# Patient Record
Sex: Female | Born: 1945 | Race: Black or African American | Hispanic: No | Marital: Married | Smoking: Never smoker
Health system: Southern US, Community
[De-identification: ages and names within clinical notes are randomized; demographics above are authoritative.]

## PROBLEM LIST (undated history)

## (undated) HISTORY — PX: BREAST EXCISIONAL BIOPSY: SUR124

---

## 1998-02-22 ENCOUNTER — Emergency Department (HOSPITAL_COMMUNITY): Admission: EM | Admit: 1998-02-22 | Discharge: 1998-02-22 | Payer: Self-pay | Admitting: Emergency Medicine

## 1999-07-10 ENCOUNTER — Encounter: Admission: RE | Admit: 1999-07-10 | Discharge: 1999-07-10 | Payer: Self-pay | Admitting: *Deleted

## 1999-07-10 ENCOUNTER — Encounter: Payer: Self-pay | Admitting: *Deleted

## 2000-08-14 ENCOUNTER — Encounter: Admission: RE | Admit: 2000-08-14 | Discharge: 2000-08-14 | Payer: Self-pay

## 2002-02-10 ENCOUNTER — Encounter: Payer: Self-pay | Admitting: *Deleted

## 2002-02-10 ENCOUNTER — Encounter: Admission: RE | Admit: 2002-02-10 | Discharge: 2002-02-10 | Payer: Self-pay | Admitting: *Deleted

## 2003-02-16 ENCOUNTER — Encounter: Admission: RE | Admit: 2003-02-16 | Discharge: 2003-02-16 | Payer: Self-pay | Admitting: Family Medicine

## 2003-02-16 ENCOUNTER — Encounter: Payer: Self-pay | Admitting: Family Medicine

## 2004-03-13 ENCOUNTER — Encounter: Admission: RE | Admit: 2004-03-13 | Discharge: 2004-03-13 | Payer: Self-pay | Admitting: Family Medicine

## 2004-06-20 ENCOUNTER — Encounter: Admission: RE | Admit: 2004-06-20 | Discharge: 2004-06-20 | Payer: Self-pay | Admitting: Family Medicine

## 2004-09-08 ENCOUNTER — Other Ambulatory Visit: Admission: RE | Admit: 2004-09-08 | Discharge: 2004-09-08 | Payer: Self-pay | Admitting: Family Medicine

## 2004-10-27 ENCOUNTER — Ambulatory Visit (HOSPITAL_COMMUNITY): Admission: RE | Admit: 2004-10-27 | Discharge: 2004-10-27 | Payer: Self-pay | Admitting: Gastroenterology

## 2006-02-26 ENCOUNTER — Encounter: Admission: RE | Admit: 2006-02-26 | Discharge: 2006-02-26 | Payer: Self-pay | Admitting: Family Medicine

## 2007-02-28 ENCOUNTER — Encounter: Admission: RE | Admit: 2007-02-28 | Discharge: 2007-02-28 | Payer: Self-pay | Admitting: Family Medicine

## 2007-04-18 ENCOUNTER — Other Ambulatory Visit: Admission: RE | Admit: 2007-04-18 | Discharge: 2007-04-18 | Payer: Self-pay | Admitting: Family Medicine

## 2007-10-23 ENCOUNTER — Emergency Department (HOSPITAL_COMMUNITY): Admission: EM | Admit: 2007-10-23 | Discharge: 2007-10-23 | Payer: Self-pay | Admitting: Emergency Medicine

## 2008-03-02 ENCOUNTER — Encounter: Admission: RE | Admit: 2008-03-02 | Discharge: 2008-03-02 | Payer: Self-pay | Admitting: Family Medicine

## 2008-11-03 IMAGING — CR DG ANKLE COMPLETE 3+V*L*
3 series · 3 of 3 positions shown · non-contrast
Comparison: None.

CLINICAL DATA: Fell and injured left ankle.

LEFT ANKLE COMPLETE - 3+ VIEW 10/23/2007:

[t ankle joint ap left]
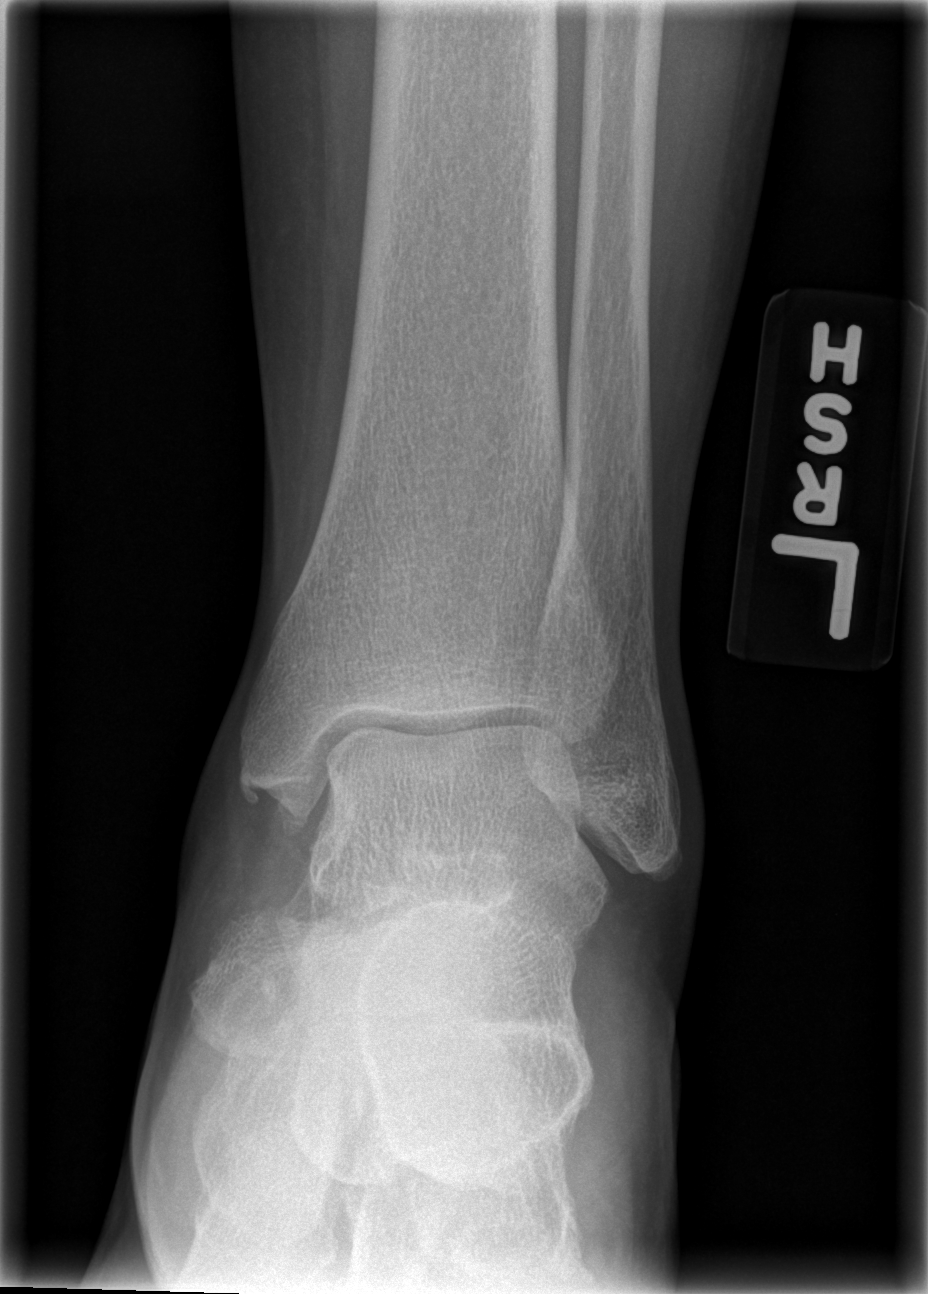

[t ankle joint oblique left]
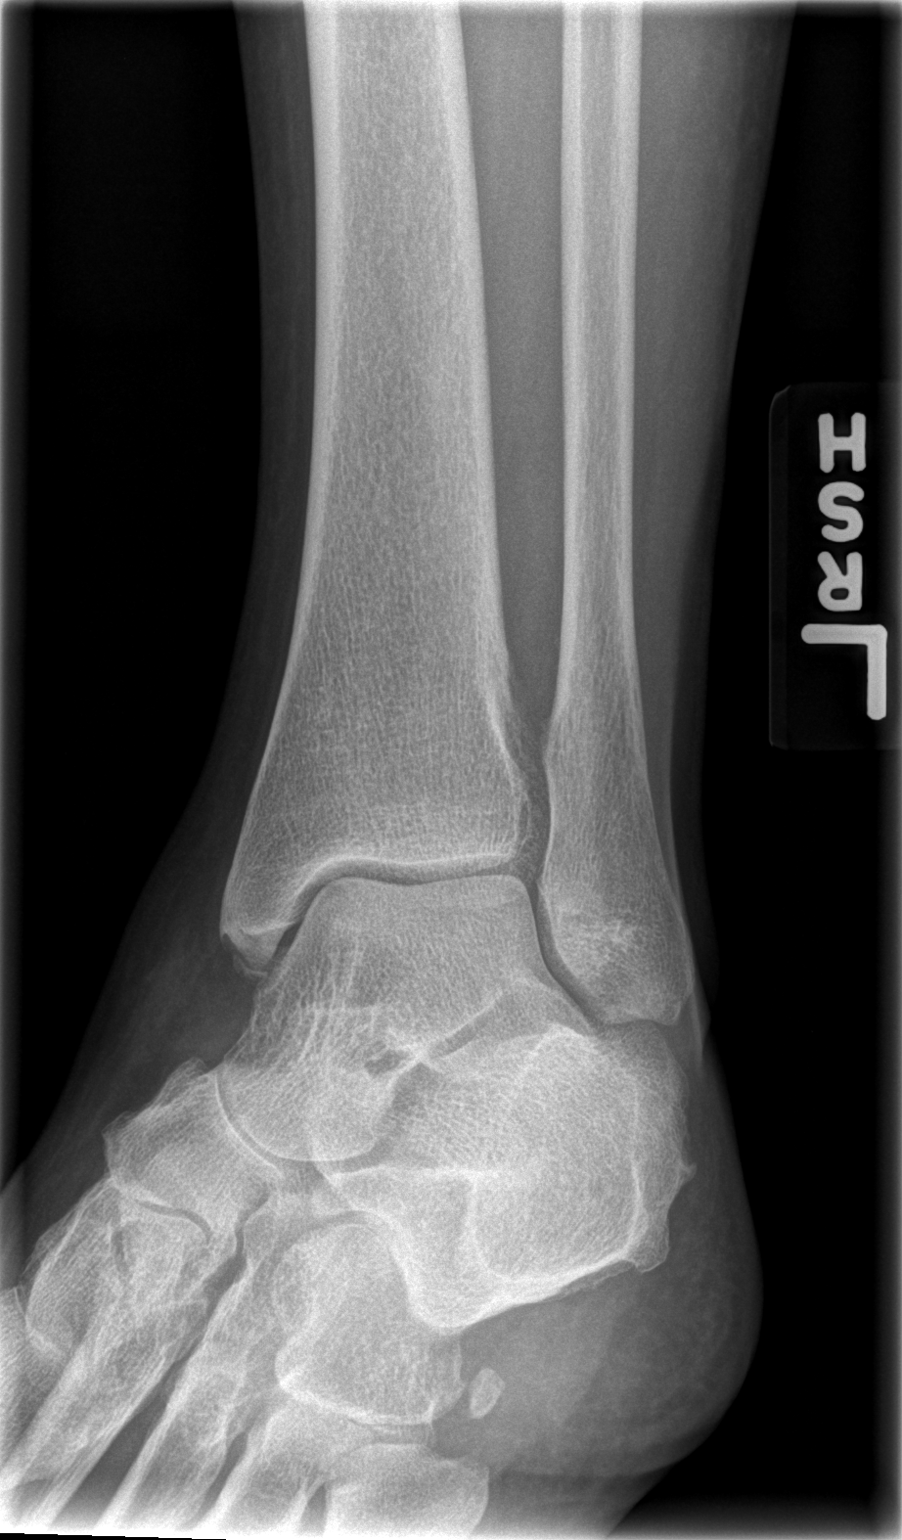

[t ankle joint lat left]
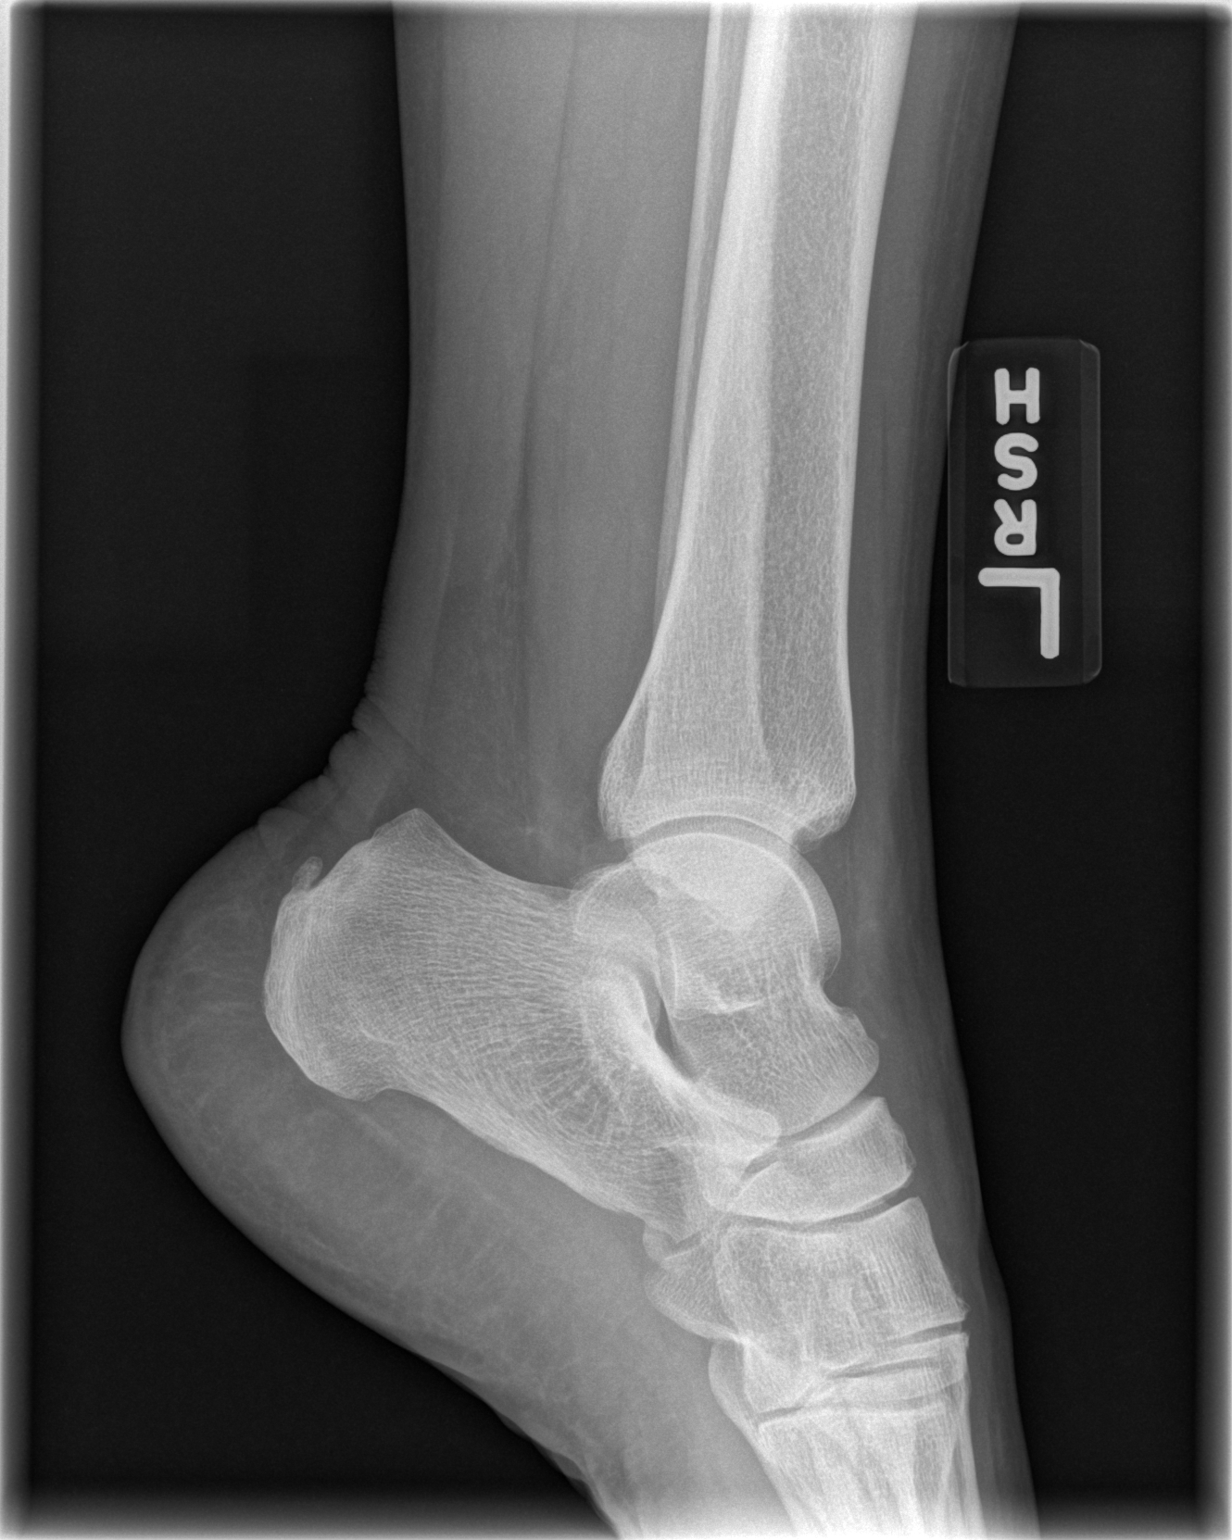

[3 of 3 positions shown; findings below may reference images not displayed]

FINDINGS: No evidence of acute fracture or dislocation.  Ankle
mortise intact.  Accessory ossicles or ligamentous ossification
related to old injuries adjacent to the medial malleolus.  Small
Achilles spur arising from the calcaneus.
IMPRESSION: No acute skeletal abnormalities.

## 2008-11-03 IMAGING — CR DG FOOT COMPLETE 3+V*L*
3 series · 3 of 3 positions shown · non-contrast
Comparison: None.

CLINICAL DATA: Fell and injured left foot.

LEFT FOOT - COMPLETE 3+ VIEW 10/23/2007:

[t foot ap left]
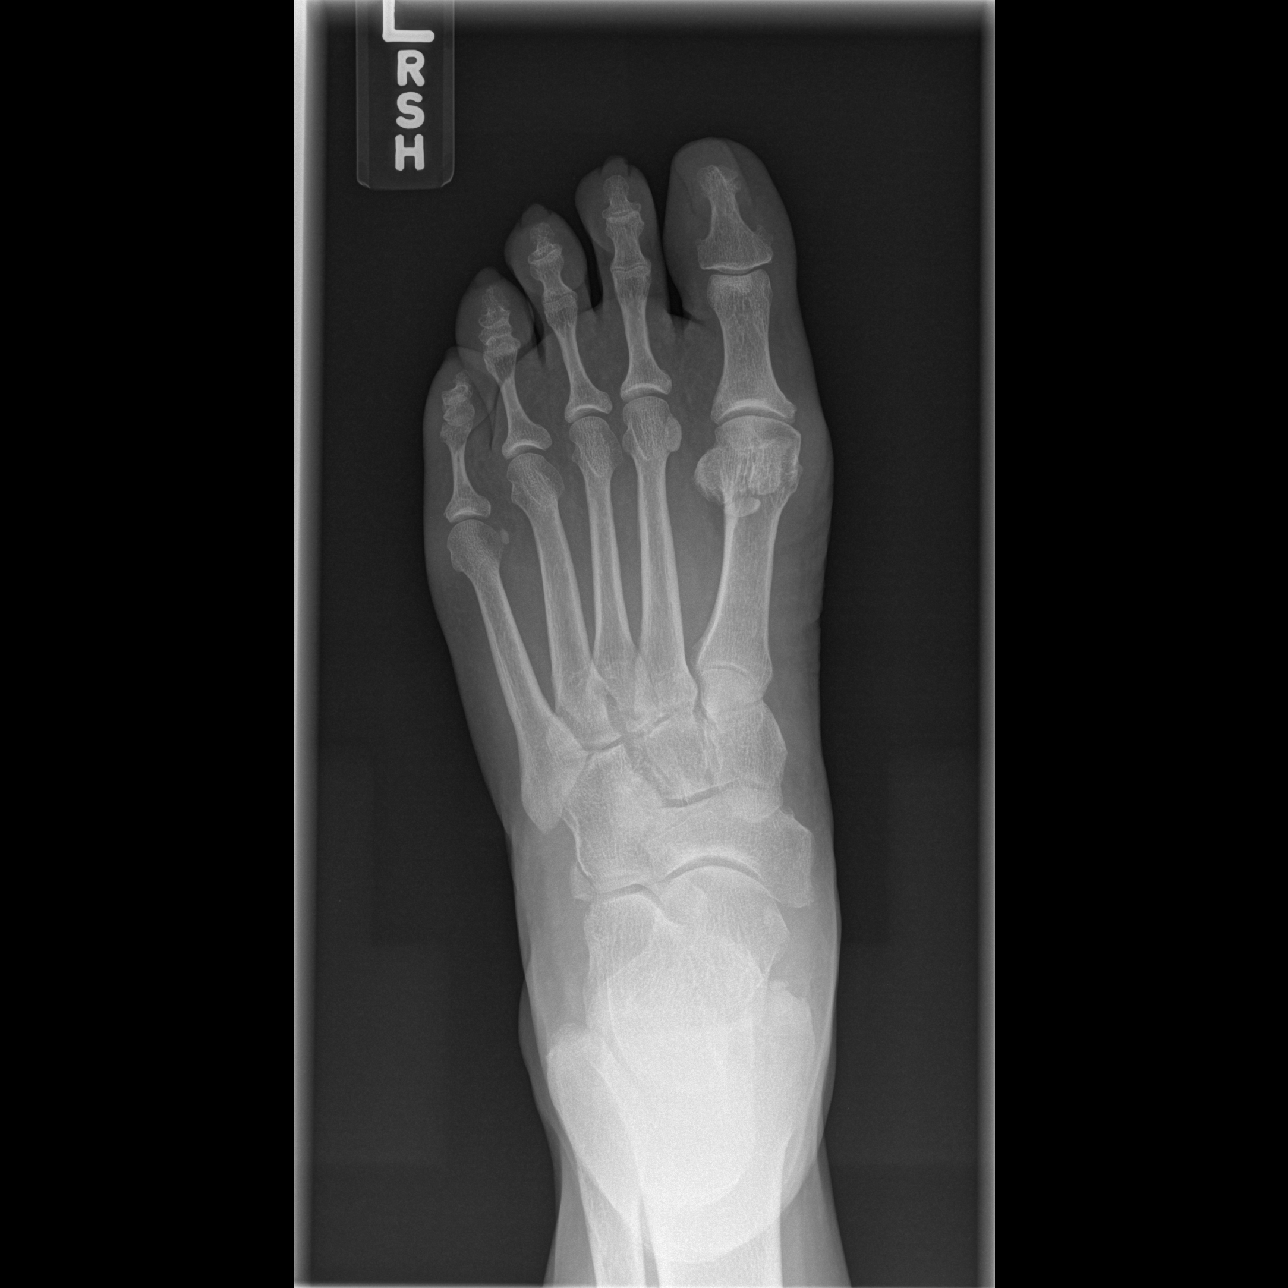

[t foot oblique left]
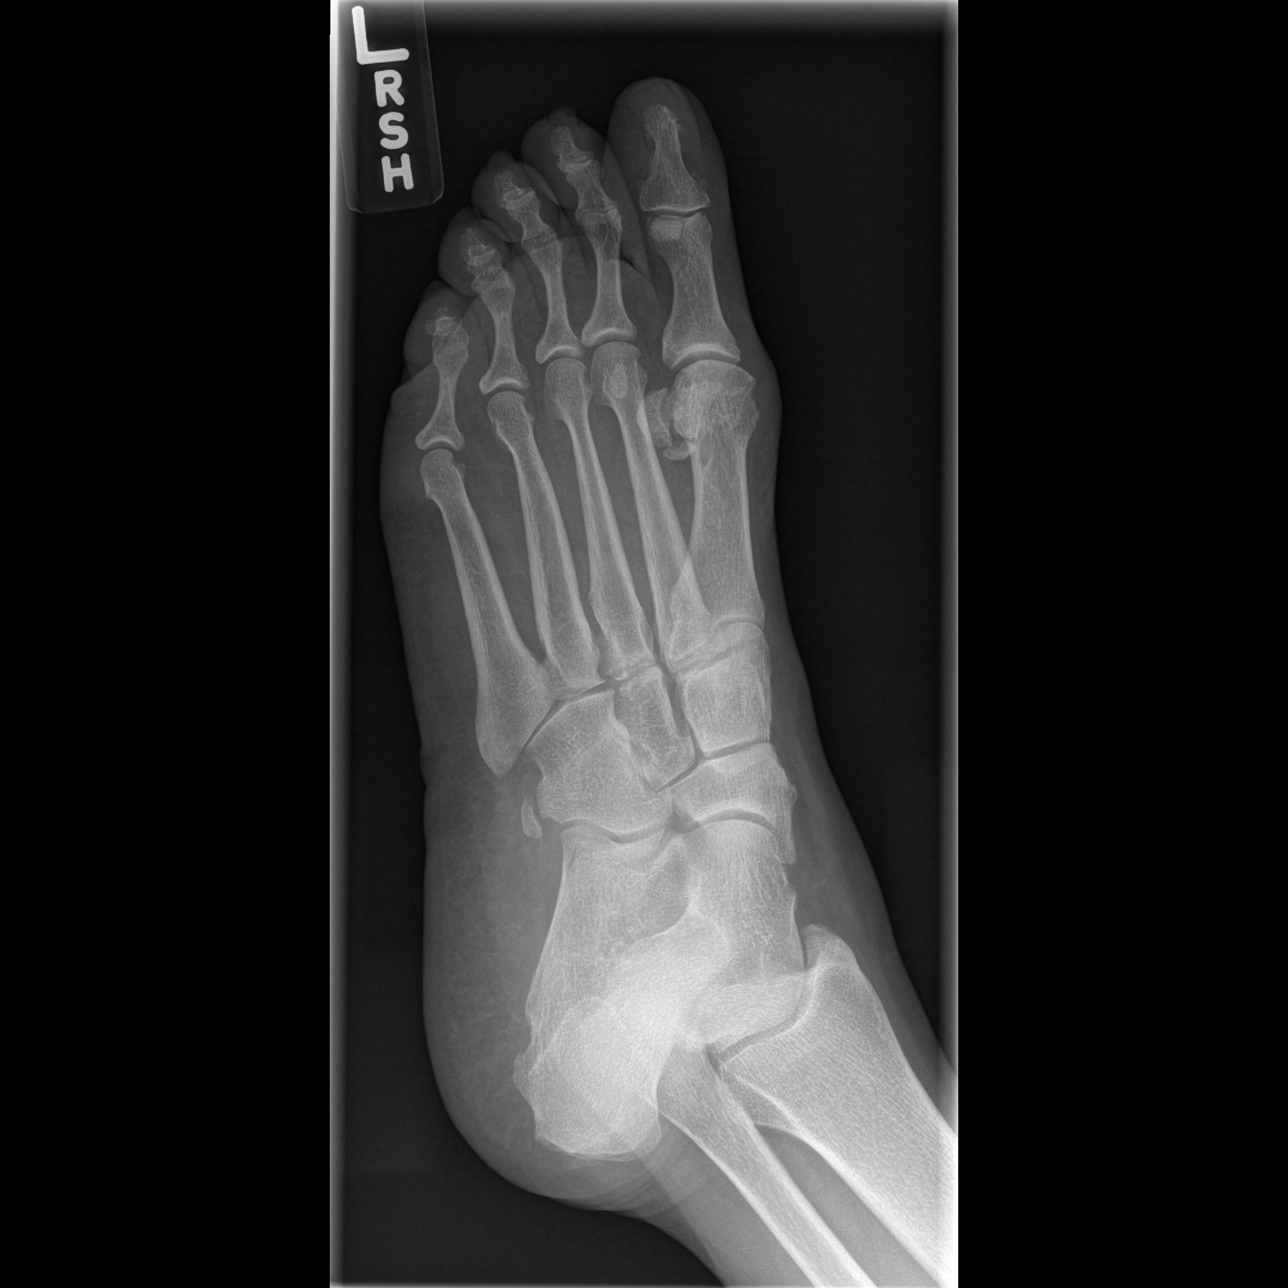

[t foot lat left]
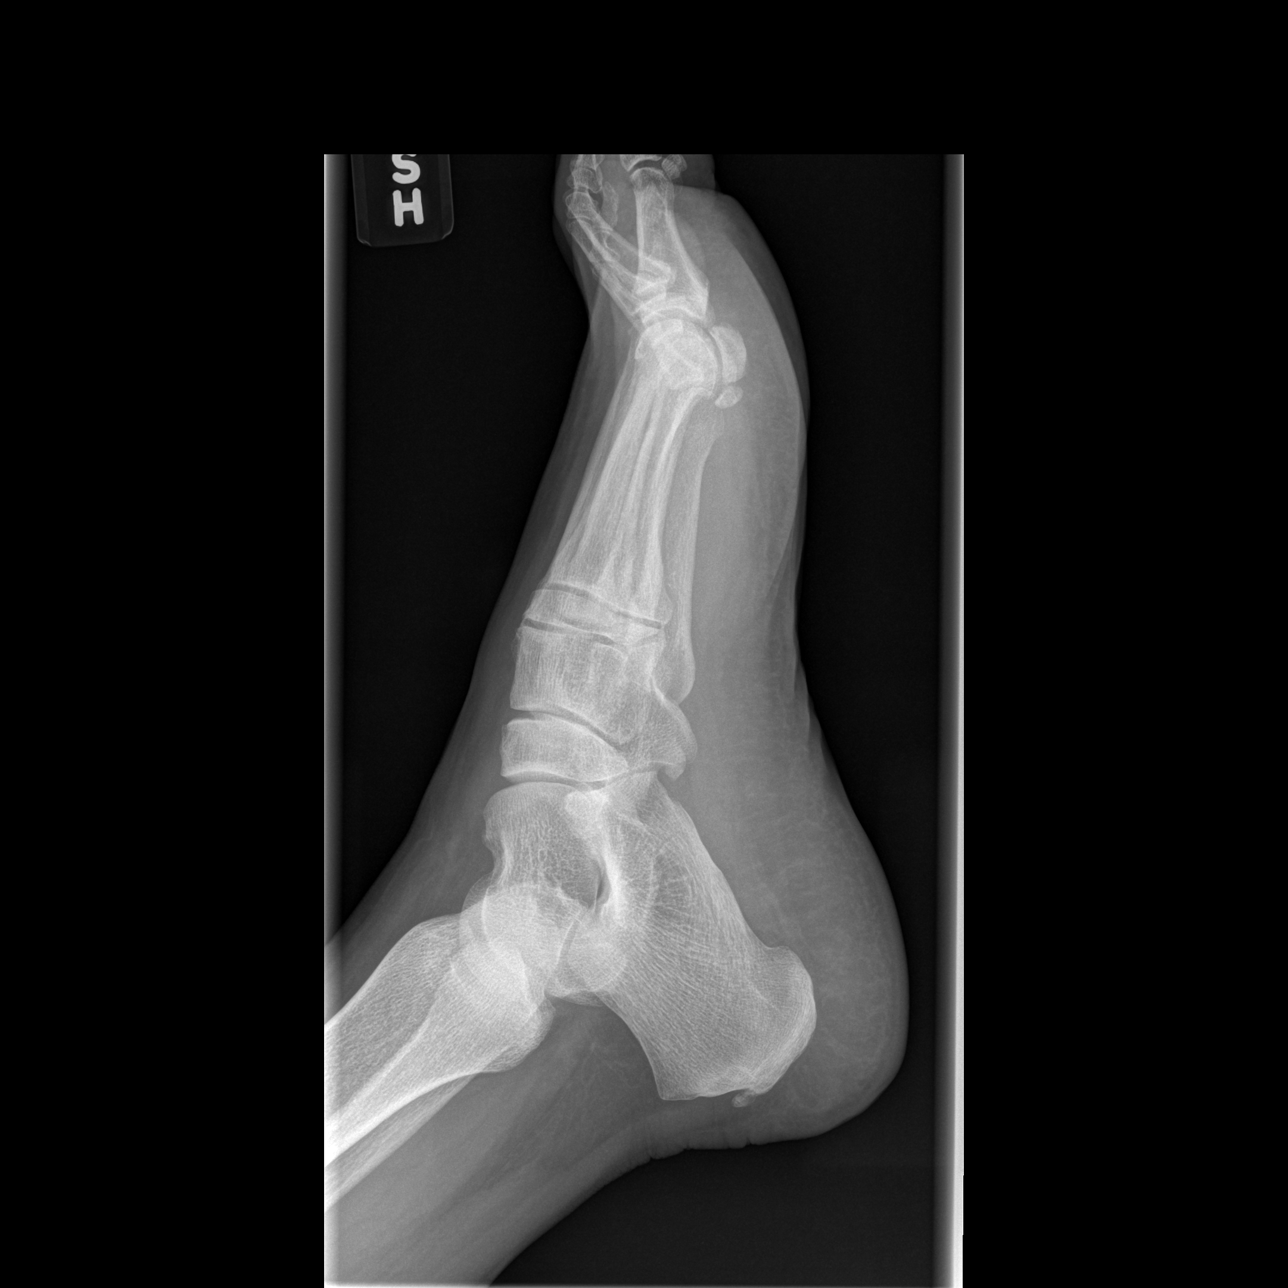

[3 of 3 positions shown; findings below may reference images not displayed]

FINDINGS: No evidence of acute fracture or dislocation.  Accessory
ossicle adjacent to the cuboid, the os peroneum.  Mild joint space
narrowing involving several IP joints, with well-preserved joint
spaces elsewhere.  Small Achilles spur arising from the calcaneus.
IMPRESSION: No acute skeletal abnormalities.

## 2009-03-03 ENCOUNTER — Encounter: Admission: RE | Admit: 2009-03-03 | Discharge: 2009-03-03 | Payer: Self-pay | Admitting: Family Medicine

## 2010-03-06 ENCOUNTER — Encounter: Admission: RE | Admit: 2010-03-06 | Discharge: 2010-03-06 | Payer: Self-pay | Admitting: Family Medicine

## 2010-09-15 NOTE — Op Note (Signed)
[name], Bailey Hines            ACCOUNT NO.:  [id number]   MEDICAL RECORD NO.:  [id number]          PATIENT TYPE:  AMB   LOCATION:  ENDO                         FACILITY:  [facility]   PHYSICIAN:  Graylin Shiver, M.D.   DATE OF BIRTH:  09/09/1945   DATE OF PROCEDURE:  10/27/2004  DATE OF DISCHARGE:                                 OPERATIVE REPORT   INDICATIONS FOR PROCEDURE:  Screening.   Informed consent was obtained after explanation of the risks of bleeding,  infection and perforation.   PREMEDICATION:  Fentanyl 50 mcg IV, Versed 6 mg IV.   PROCEDURE:  With the patient in the left lateral decubitus position, a  rectal exam was performed. No masses were felt. The Olympus colonoscope was  inserted into the rectum and advanced around the colon to the cecum. Cecal  landmarks were identified. The cecum and ascending colon were normal. The  transverse colon normal. The descending colon normal. The sigmoid showed a  few diverticula and the rectum was normal. She tolerated the procedure well  without complications.   IMPRESSION:  Few diverticula noted in the sigmoid, otherwise normal  colonoscopy to the cecum.   I would recommend a follow-up screening colonoscopy again in 10 years.       SFG/MEDQ  D:  10/27/2004  T:  10/27/2004  Job:  [id number]   cc:   Stacie Acres. White, M.D.  [address]  [place]  [place] [address]  Fax: [phone]

## 2011-01-23 ENCOUNTER — Other Ambulatory Visit: Payer: Self-pay | Admitting: Family Medicine

## 2011-01-23 DIAGNOSIS — Z1231 Encounter for screening mammogram for malignant neoplasm of breast: Secondary | ICD-10-CM

## 2011-03-09 ENCOUNTER — Ambulatory Visit
Admission: RE | Admit: 2011-03-09 | Discharge: 2011-03-09 | Disposition: A | Discharge status: Home / Self Care | Payer: Medicare Other | Source: Ambulatory Visit | Attending: Family Medicine | Admitting: Family Medicine

## 2011-03-09 DIAGNOSIS — Z1231 Encounter for screening mammogram for malignant neoplasm of breast: Secondary | ICD-10-CM

## 2011-03-12 ENCOUNTER — Other Ambulatory Visit: Payer: Self-pay | Admitting: Family Medicine

## 2011-03-12 DIAGNOSIS — R928 Other abnormal and inconclusive findings on diagnostic imaging of breast: Secondary | ICD-10-CM

## 2011-03-29 ENCOUNTER — Ambulatory Visit
Admission: RE | Admit: 2011-03-29 | Discharge: 2011-03-29 | Disposition: A | Discharge status: Home / Self Care | Payer: Medicare Other | Source: Ambulatory Visit | Attending: Family Medicine | Admitting: Family Medicine

## 2011-03-29 DIAGNOSIS — R928 Other abnormal and inconclusive findings on diagnostic imaging of breast: Secondary | ICD-10-CM

## 2011-07-06 DIAGNOSIS — L259 Unspecified contact dermatitis, unspecified cause: Secondary | ICD-10-CM | POA: Diagnosis not present

## 2011-09-27 DIAGNOSIS — E7800 Pure hypercholesterolemia, unspecified: Secondary | ICD-10-CM | POA: Diagnosis not present

## 2012-02-01 ENCOUNTER — Other Ambulatory Visit: Payer: Self-pay | Admitting: Family Medicine

## 2012-02-01 DIAGNOSIS — Z1231 Encounter for screening mammogram for malignant neoplasm of breast: Secondary | ICD-10-CM

## 2012-03-10 ENCOUNTER — Ambulatory Visit
Admission: RE | Admit: 2012-03-10 | Discharge: 2012-03-10 | Disposition: A | Discharge status: Home / Self Care | Payer: Medicare Other | Source: Ambulatory Visit | Attending: Family Medicine | Admitting: Family Medicine

## 2012-03-10 DIAGNOSIS — Z1231 Encounter for screening mammogram for malignant neoplasm of breast: Secondary | ICD-10-CM

## 2012-05-06 DIAGNOSIS — E7800 Pure hypercholesterolemia, unspecified: Secondary | ICD-10-CM | POA: Diagnosis not present

## 2012-10-17 DIAGNOSIS — H2510 Age-related nuclear cataract, unspecified eye: Secondary | ICD-10-CM | POA: Diagnosis not present

## 2012-10-17 DIAGNOSIS — H40009 Preglaucoma, unspecified, unspecified eye: Secondary | ICD-10-CM | POA: Diagnosis not present

## 2013-02-02 DIAGNOSIS — L658 Other specified nonscarring hair loss: Secondary | ICD-10-CM | POA: Diagnosis not present

## 2013-02-02 DIAGNOSIS — L282 Other prurigo: Secondary | ICD-10-CM | POA: Diagnosis not present

## 2013-02-05 ENCOUNTER — Other Ambulatory Visit: Payer: Self-pay

## 2013-02-05 DIAGNOSIS — Z1231 Encounter for screening mammogram for malignant neoplasm of breast: Secondary | ICD-10-CM

## 2013-02-13 DIAGNOSIS — E7800 Pure hypercholesterolemia, unspecified: Secondary | ICD-10-CM | POA: Diagnosis not present

## 2013-03-13 ENCOUNTER — Ambulatory Visit
Admission: RE | Admit: 2013-03-13 | Discharge: 2013-03-13 | Disposition: A | Discharge status: Home / Self Care | Payer: Medicare Other | Source: Ambulatory Visit

## 2013-03-13 DIAGNOSIS — Z1231 Encounter for screening mammogram for malignant neoplasm of breast: Secondary | ICD-10-CM

## 2013-04-02 DIAGNOSIS — E785 Hyperlipidemia, unspecified: Secondary | ICD-10-CM | POA: Diagnosis not present

## 2013-04-02 DIAGNOSIS — Z1331 Encounter for screening for depression: Secondary | ICD-10-CM | POA: Diagnosis not present

## 2013-04-02 DIAGNOSIS — R030 Elevated blood-pressure reading, without diagnosis of hypertension: Secondary | ICD-10-CM | POA: Diagnosis not present

## 2013-04-02 DIAGNOSIS — Z23 Encounter for immunization: Secondary | ICD-10-CM | POA: Diagnosis not present

## 2013-05-05 DIAGNOSIS — Z79899 Other long term (current) drug therapy: Secondary | ICD-10-CM | POA: Diagnosis not present

## 2013-05-05 DIAGNOSIS — E785 Hyperlipidemia, unspecified: Secondary | ICD-10-CM | POA: Diagnosis not present

## 2013-05-05 DIAGNOSIS — I10 Essential (primary) hypertension: Secondary | ICD-10-CM | POA: Diagnosis not present

## 2013-07-12 DIAGNOSIS — S93409A Sprain of unspecified ligament of unspecified ankle, initial encounter: Secondary | ICD-10-CM | POA: Diagnosis not present

## 2013-09-01 DIAGNOSIS — M25579 Pain in unspecified ankle and joints of unspecified foot: Secondary | ICD-10-CM | POA: Diagnosis not present

## 2013-10-12 DIAGNOSIS — E785 Hyperlipidemia, unspecified: Secondary | ICD-10-CM | POA: Diagnosis not present

## 2013-10-12 DIAGNOSIS — I10 Essential (primary) hypertension: Secondary | ICD-10-CM | POA: Diagnosis not present

## 2014-02-10 ENCOUNTER — Other Ambulatory Visit: Payer: Self-pay

## 2014-02-10 DIAGNOSIS — Z1231 Encounter for screening mammogram for malignant neoplasm of breast: Secondary | ICD-10-CM

## 2014-03-15 ENCOUNTER — Ambulatory Visit
Admission: RE | Admit: 2014-03-15 | Discharge: 2014-03-15 | Disposition: A | Discharge status: Home / Self Care | Payer: Medicare Other | Source: Ambulatory Visit

## 2014-03-15 ENCOUNTER — Encounter (INDEPENDENT_AMBULATORY_CARE_PROVIDER_SITE_OTHER): Payer: Self-pay

## 2014-03-15 DIAGNOSIS — Z1231 Encounter for screening mammogram for malignant neoplasm of breast: Secondary | ICD-10-CM

## 2014-03-25 IMAGING — MG MM SCREEN MAMMOGRAM BILATERAL
4 series · 4 of 4 positions shown · non-contrast
Comparison: Previous exam(s).

CLINICAL DATA: Screening.

EXAM:
DIGITAL SCREENING BILATERAL MAMMOGRAM WITH CAD

[R CC]
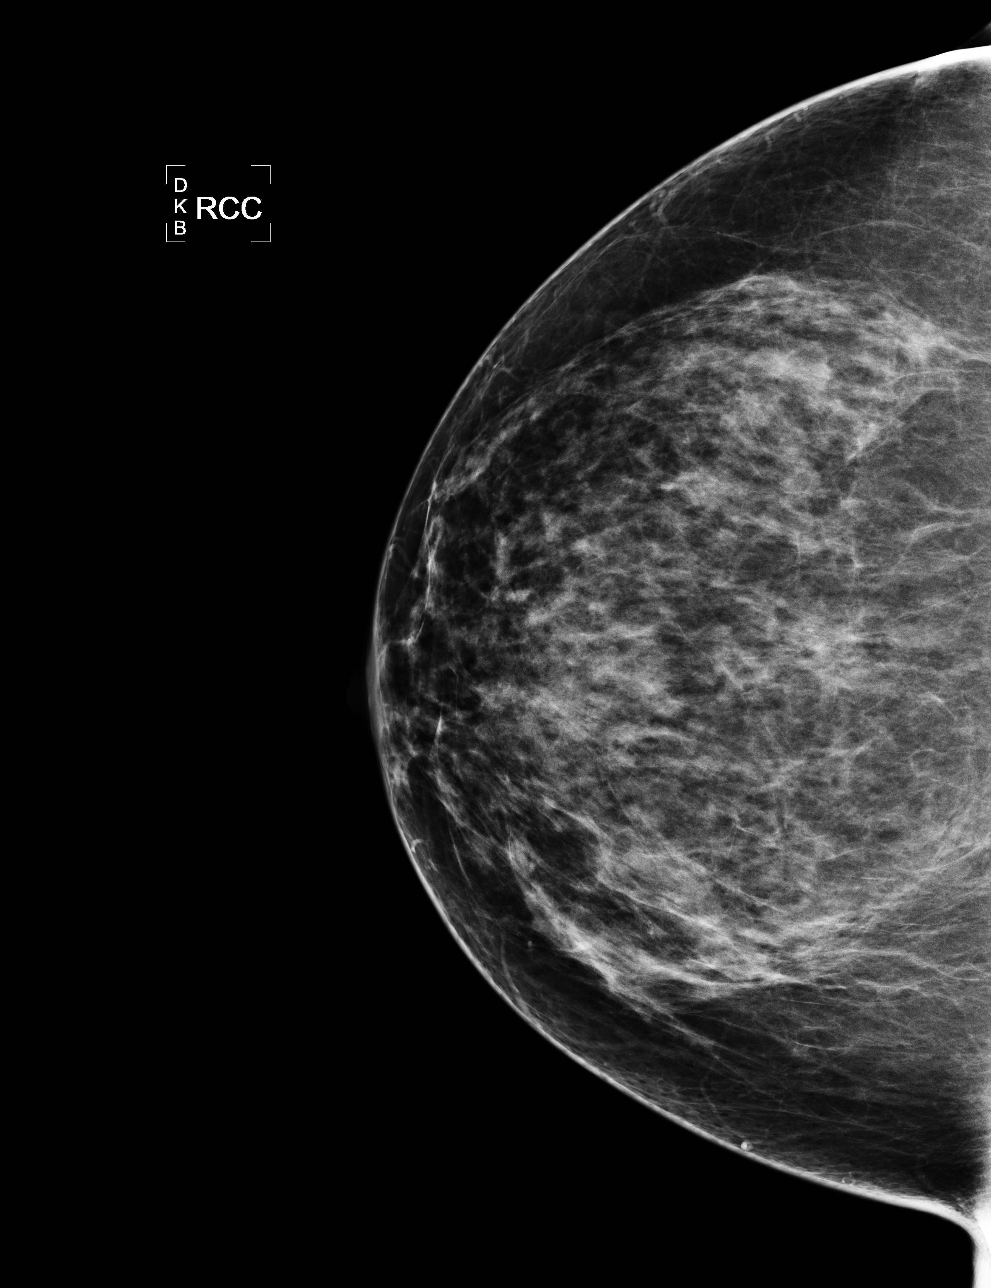

[L CC]
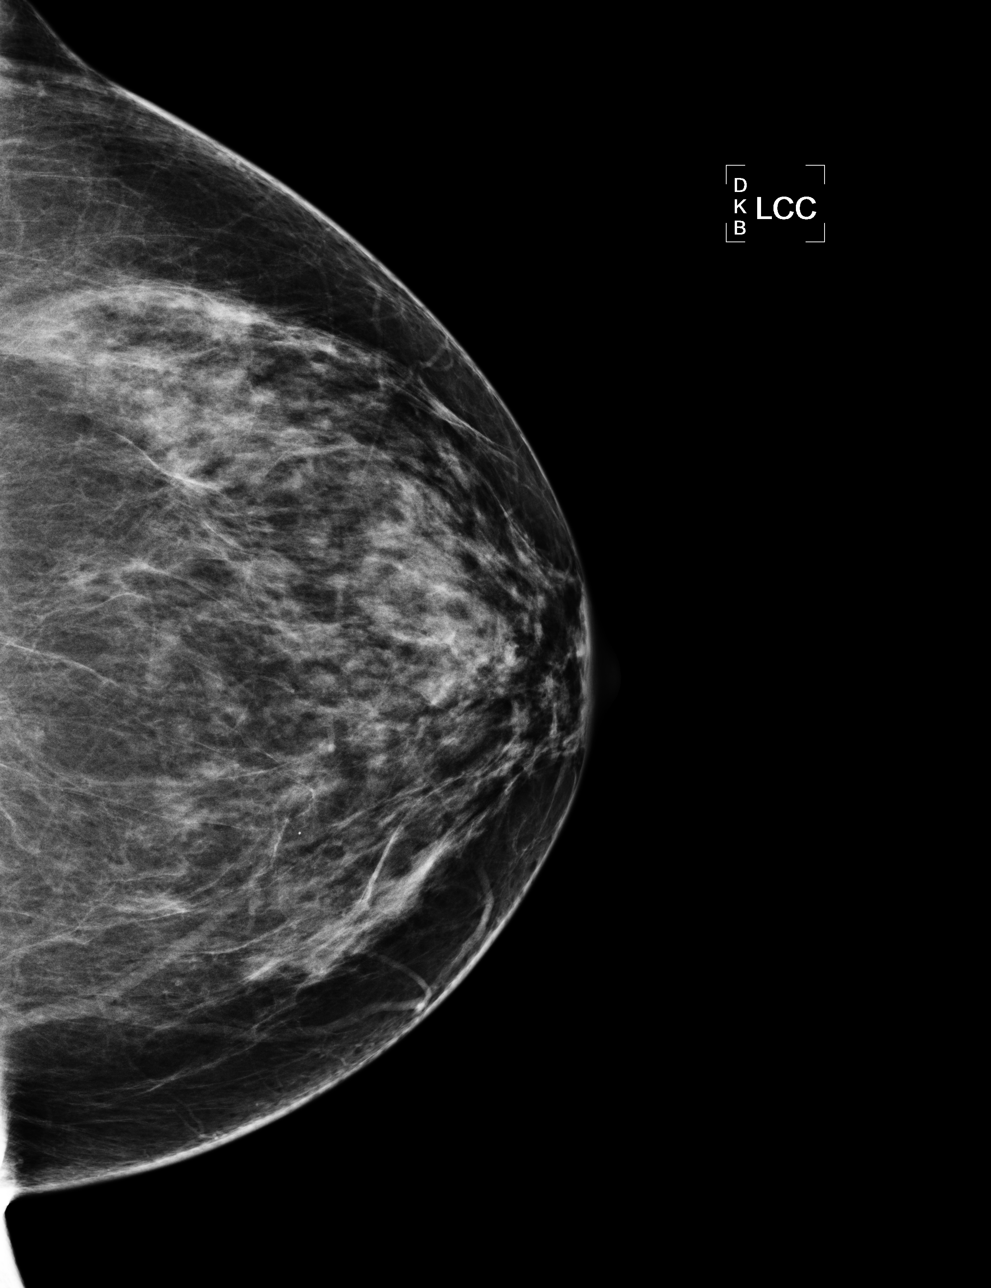

[L MLO]
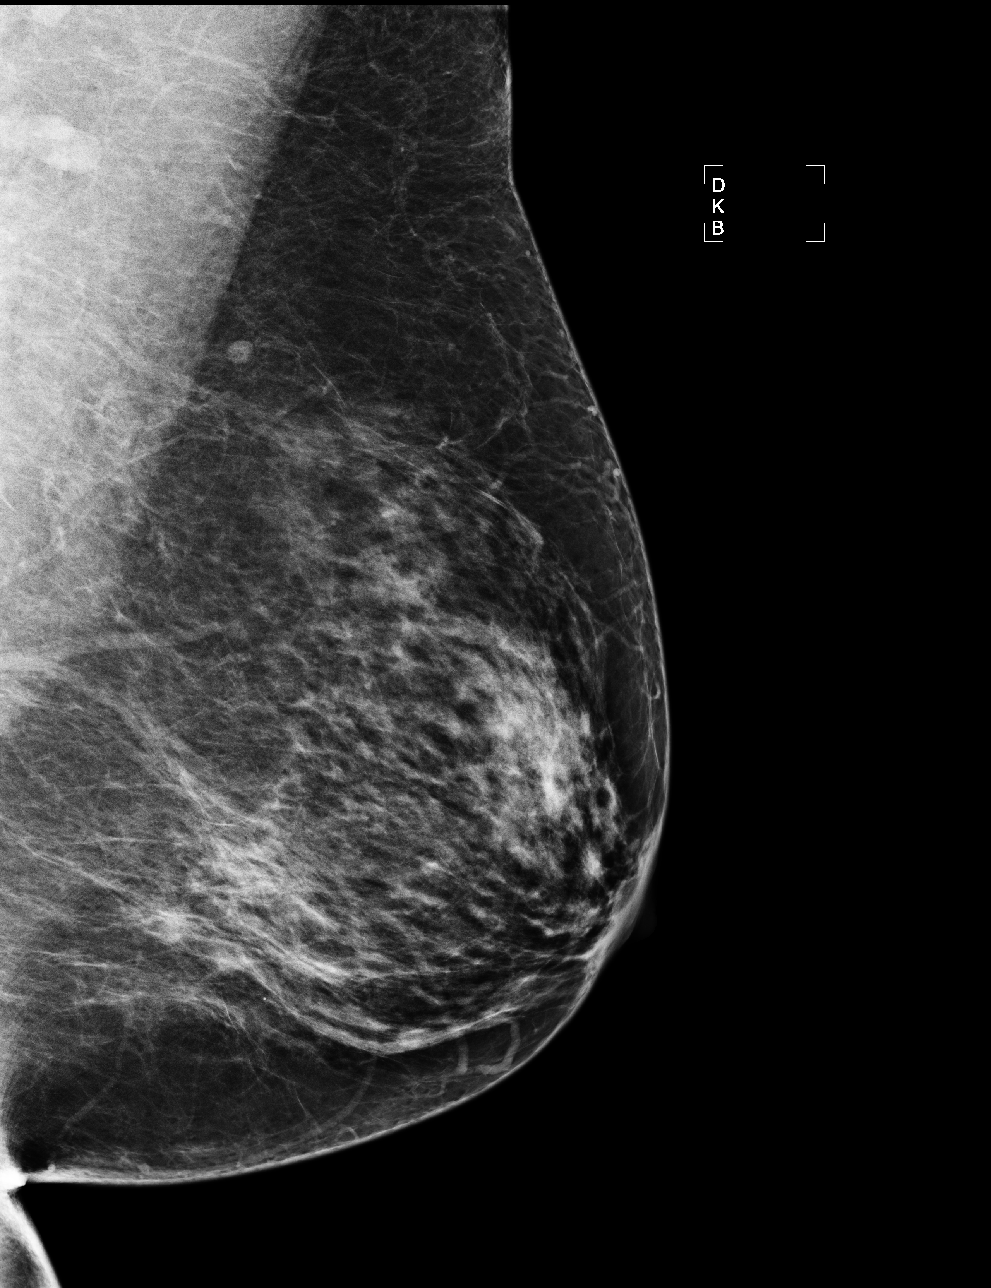

[R MLO]
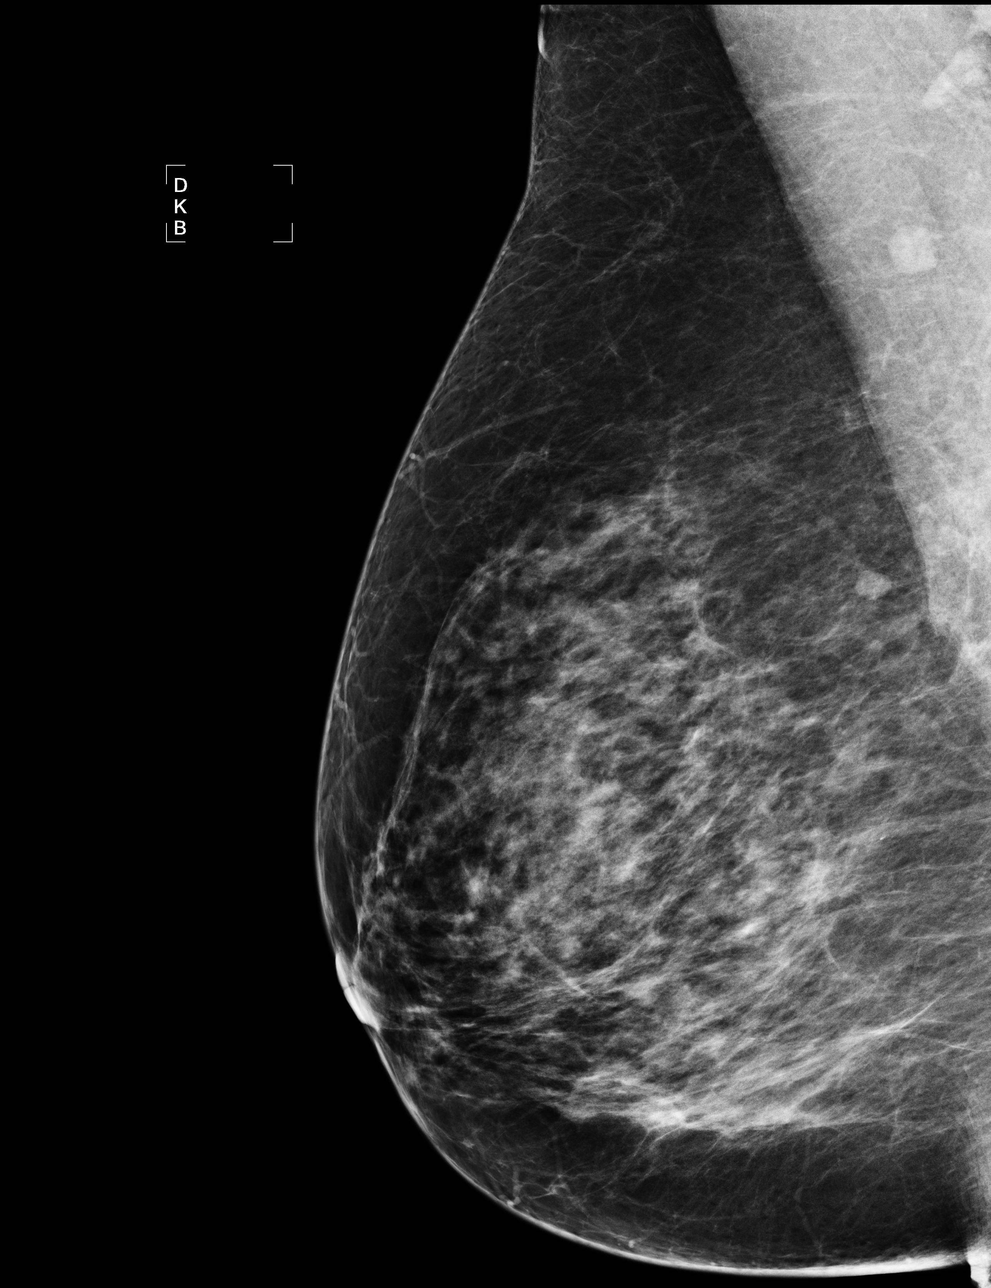

[4 of 4 positions shown; findings below may reference images not displayed]

ACR Breast Density Category c: The breasts are heterogeneously
dense, which may obscure small masses.
FINDINGS: There are no findings suspicious for malignancy. Images were
processed with CAD.
IMPRESSION: No mammographic evidence of malignancy. A result letter of this
screening mammogram will be mailed directly to the patient.

RECOMMENDATION:
Screening mammogram in one year. (Code:[id number])

BI-RADS CATEGORY  1: Negative

## 2014-04-14 ENCOUNTER — Other Ambulatory Visit (HOSPITAL_COMMUNITY)
Admission: RE | Admit: 2014-04-14 | Discharge: 2014-04-14 | Disposition: A | Discharge status: Home / Self Care | Payer: Medicare Other | Source: Ambulatory Visit | Attending: Family Medicine | Admitting: Family Medicine

## 2014-04-14 ENCOUNTER — Other Ambulatory Visit: Payer: Self-pay | Admitting: Family Medicine

## 2014-04-14 DIAGNOSIS — E785 Hyperlipidemia, unspecified: Secondary | ICD-10-CM | POA: Diagnosis not present

## 2014-04-14 DIAGNOSIS — Z124 Encounter for screening for malignant neoplasm of cervix: Secondary | ICD-10-CM | POA: Diagnosis not present

## 2014-04-14 DIAGNOSIS — I10 Essential (primary) hypertension: Secondary | ICD-10-CM | POA: Diagnosis not present

## 2014-04-14 DIAGNOSIS — N951 Menopausal and female climacteric states: Secondary | ICD-10-CM | POA: Diagnosis not present

## 2014-04-14 DIAGNOSIS — Z0000 Encounter for general adult medical examination without abnormal findings: Secondary | ICD-10-CM | POA: Diagnosis not present

## 2014-04-14 DIAGNOSIS — Z23 Encounter for immunization: Secondary | ICD-10-CM | POA: Diagnosis not present

## 2014-04-14 DIAGNOSIS — R3915 Urgency of urination: Secondary | ICD-10-CM | POA: Diagnosis not present

## 2014-04-14 DIAGNOSIS — R87619 Unspecified abnormal cytological findings in specimens from cervix uteri: Secondary | ICD-10-CM | POA: Insufficient documentation

## 2014-04-14 DIAGNOSIS — R87610 Atypical squamous cells of undetermined significance on cytologic smear of cervix (ASC-US): Secondary | ICD-10-CM | POA: Diagnosis not present

## 2014-04-14 DIAGNOSIS — Z1151 Encounter for screening for human papillomavirus (HPV): Secondary | ICD-10-CM | POA: Insufficient documentation

## 2014-04-14 DIAGNOSIS — Z1211 Encounter for screening for malignant neoplasm of colon: Secondary | ICD-10-CM | POA: Diagnosis not present

## 2014-04-14 DIAGNOSIS — A6000 Herpesviral infection of urogenital system, unspecified: Secondary | ICD-10-CM | POA: Diagnosis not present

## 2014-04-15 LAB — CYTOLOGY - PAP

## 2014-05-10 DIAGNOSIS — N951 Menopausal and female climacteric states: Secondary | ICD-10-CM | POA: Diagnosis not present

## 2015-02-07 ENCOUNTER — Other Ambulatory Visit: Payer: Self-pay

## 2015-02-07 DIAGNOSIS — Z1231 Encounter for screening mammogram for malignant neoplasm of breast: Secondary | ICD-10-CM

## 2015-03-17 ENCOUNTER — Ambulatory Visit
Admission: RE | Admit: 2015-03-17 | Discharge: 2015-03-17 | Disposition: A | Discharge status: Home / Self Care | Payer: Medicare PPO | Source: Ambulatory Visit

## 2015-03-17 DIAGNOSIS — Z1231 Encounter for screening mammogram for malignant neoplasm of breast: Secondary | ICD-10-CM

## 2015-03-18 ENCOUNTER — Other Ambulatory Visit: Payer: Self-pay | Admitting: Family Medicine

## 2015-03-18 DIAGNOSIS — R928 Other abnormal and inconclusive findings on diagnostic imaging of breast: Secondary | ICD-10-CM

## 2015-03-30 ENCOUNTER — Other Ambulatory Visit: Payer: Medicare PPO

## 2015-04-07 ENCOUNTER — Ambulatory Visit
Admission: RE | Admit: 2015-04-07 | Discharge: 2015-04-07 | Disposition: A | Discharge status: Home / Self Care | Payer: Medicare PPO | Source: Ambulatory Visit | Attending: Family Medicine | Admitting: Family Medicine

## 2015-04-07 DIAGNOSIS — R928 Other abnormal and inconclusive findings on diagnostic imaging of breast: Secondary | ICD-10-CM

## 2015-05-30 ENCOUNTER — Other Ambulatory Visit: Payer: Self-pay | Admitting: Family Medicine

## 2015-05-30 ENCOUNTER — Other Ambulatory Visit (HOSPITAL_COMMUNITY)
Admission: RE | Admit: 2015-05-30 | Discharge: 2015-05-30 | Disposition: A | Discharge status: Home / Self Care | Payer: Medicare Other | Source: Ambulatory Visit | Attending: Family Medicine | Admitting: Family Medicine

## 2015-05-30 DIAGNOSIS — Z124 Encounter for screening for malignant neoplasm of cervix: Secondary | ICD-10-CM | POA: Insufficient documentation

## 2015-05-31 LAB — CYTOLOGY - PAP

## 2016-02-27 ENCOUNTER — Other Ambulatory Visit: Payer: Self-pay | Admitting: Family Medicine

## 2016-02-27 DIAGNOSIS — Z1231 Encounter for screening mammogram for malignant neoplasm of breast: Secondary | ICD-10-CM

## 2016-04-03 ENCOUNTER — Ambulatory Visit
Admission: RE | Admit: 2016-04-03 | Discharge: 2016-04-03 | Disposition: A | Discharge status: Home / Self Care | Payer: Medicare Other | Source: Ambulatory Visit | Attending: Family Medicine | Admitting: Family Medicine

## 2016-04-03 DIAGNOSIS — Z1231 Encounter for screening mammogram for malignant neoplasm of breast: Secondary | ICD-10-CM

## 2017-02-22 ENCOUNTER — Other Ambulatory Visit: Payer: Self-pay | Admitting: Family Medicine

## 2017-02-22 DIAGNOSIS — Z1231 Encounter for screening mammogram for malignant neoplasm of breast: Secondary | ICD-10-CM

## 2017-04-04 ENCOUNTER — Ambulatory Visit
Admission: RE | Admit: 2017-04-04 | Discharge: 2017-04-04 | Disposition: A | Discharge status: Home / Self Care | Payer: Medicare Other | Source: Ambulatory Visit | Attending: Family Medicine | Admitting: Family Medicine

## 2017-04-04 DIAGNOSIS — Z1231 Encounter for screening mammogram for malignant neoplasm of breast: Secondary | ICD-10-CM

## 2018-02-24 ENCOUNTER — Other Ambulatory Visit: Payer: Self-pay | Admitting: Family Medicine

## 2018-02-24 DIAGNOSIS — Z1231 Encounter for screening mammogram for malignant neoplasm of breast: Secondary | ICD-10-CM

## 2018-04-08 ENCOUNTER — Ambulatory Visit
Admission: RE | Admit: 2018-04-08 | Discharge: 2018-04-08 | Disposition: A | Discharge status: Home / Self Care | Payer: Medicare Other | Source: Ambulatory Visit | Attending: Family Medicine | Admitting: Family Medicine

## 2018-04-08 DIAGNOSIS — Z1231 Encounter for screening mammogram for malignant neoplasm of breast: Secondary | ICD-10-CM

## 2018-12-08 ENCOUNTER — Ambulatory Visit (HOSPITAL_COMMUNITY)
Admission: EM | Admit: 2018-12-08 | Discharge: 2018-12-08 | Disposition: A | Discharge status: Home / Self Care | Payer: Medicare Other | Attending: Family Medicine | Admitting: Family Medicine

## 2018-12-08 ENCOUNTER — Encounter (HOSPITAL_COMMUNITY): Payer: Self-pay

## 2018-12-08 ENCOUNTER — Other Ambulatory Visit: Payer: Self-pay

## 2018-12-08 DIAGNOSIS — Z20828 Contact with and (suspected) exposure to other viral communicable diseases: Secondary | ICD-10-CM

## 2018-12-08 DIAGNOSIS — Z20822 Contact with and (suspected) exposure to covid-19: Secondary | ICD-10-CM

## 2018-12-08 NOTE — ED Provider Notes (Signed)
[facility]    CSN: [id number] Arrival date & time: 12/08/18  1059     History   Chief Complaint Chief Complaint  Patient presents with  . Covid Test    HPI Bailey Hines is a [age] y.o. female.   HPI  Patient is without any symptoms.  No fever chills, no body ache, no respiratory symptoms.  She was visited by her nephew.  The following day his mother was hospitalized with COVID-19.  Uncertain if the nephew was sick.  Is here requesting COVID-19 testing  History reviewed. No pertinent past medical history.  There are no active problems to display for this patient.   History reviewed. No pertinent surgical history.  OB History   No obstetric history on file.      Home Medications    Prior to Admission medications   Not on File    Family History Family History  Family history unknown: Yes    Social History Social History   Tobacco Use  . Smoking status: Never Smoker  Substance Use Topics  . Alcohol use: Not on file  . Drug use: Not on file     Allergies   Penicillins   Review of Systems Review of Systems  Constitutional: Negative for chills and fever.  HENT: Negative for ear pain and sore throat.   Eyes: Negative for pain and visual disturbance.  Respiratory: Negative for cough and shortness of breath.   Cardiovascular: Negative for chest pain and palpitations.  Gastrointestinal: Negative for abdominal pain and vomiting.  Genitourinary: Negative for dysuria and hematuria.  Musculoskeletal: Negative for arthralgias and back pain.  Skin: Negative for color change and rash.  Neurological: Negative for seizures and syncope.  All other systems reviewed and are negative.    Physical Exam  Updated Vital Signs BP (!) 142/74 (BP Location: Left Arm)   Pulse 94   Temp 98.8 F (37.1 C) (Oral)   Resp 17   SpO2 100%       Physical Exam Constitutional:      General: She is not in acute distress.    Appearance: She is  well-developed.  HENT:     Head: Normocephalic and atraumatic.  Eyes:     Conjunctiva/sclera: Conjunctivae normal.     Pupils: Pupils are equal, round, and reactive to light.  Neck:     Musculoskeletal: Normal range of motion.  Cardiovascular:     Rate and Rhythm: Normal rate.  Pulmonary:     Effort: Pulmonary effort is normal. No respiratory distress.  Abdominal:     General: There is no distension.     Palpations: Abdomen is soft.  Musculoskeletal: Normal range of motion.  Skin:    General: Skin is warm and dry.  Neurological:     Mental Status: She is alert.      UC Treatments / Results  Labs (all labs ordered are listed, but only abnormal results are displayed) Labs Reviewed  NOVEL CORONAVIRUS, NAA (HOSPITAL ORDER, SEND-OUT TO REF LAB)    EKG   Radiology No results found.  Procedures Procedures (including critical care time)  Medications Ordered in UC Medications - No data to display  Initial Impression / Assessment and Plan / UC Course  I have reviewed the triage vital signs and the nursing notes.  Pertinent labs & imaging results that were available during my care of the patient were reviewed by me and considered in my medical decision making (see chart for details).  Discussed exposure to COVID-19.  Symptoms to watch for.  Treatment with Tylenol if needed.  Importance of quarantine until test results are available. Final Clinical Impressions(s) / UC Diagnoses   Final diagnoses:  Exposure to Covid-19 Virus     Discharge Instructions        Person Under Monitoring Name: Bailey Hines  [place] [address] [place] [id number]   Infection Prevention Recommendations for Individuals Confirmed to have, or Being Evaluated for, 2019 Novel Coronavirus (COVID-19) Infection Who Receive Care at Home  Individuals who are confirmed to have, or are being evaluated for, COVID-19 should follow the prevention steps below until a  healthcare provider or local or state health department says they can return to normal activities.  Stay home except to get medical care You should restrict activities outside your home, except for getting medical care. Do not go to work, school, or public areas, and do not use public transportation or taxis.  Call ahead before visiting your doctor Before your medical appointment, call the healthcare provider and tell them that you have, or are being evaluated for, COVID-19 infection. This will help the healthcare provider's office take steps to keep other people from getting infected. Ask your healthcare provider to call the local or state health department.  Monitor your symptoms Seek prompt medical attention if your illness is worsening (e.g., difficulty breathing). Before going to your medical appointment, call the healthcare provider and tell them that you have, or are being evaluated for, COVID-19 infection. Ask your healthcare provider to call the local or state health department.  Wear a facemask You should wear a facemask that covers your nose and mouth when you are in the same room with other people and when you visit a healthcare provider. People who live with or visit you should also wear a facemask while they are in the same room with you.  Separate yourself from other people in your home As much as possible, you should stay in a different room from other people in your home. Also, you should use a separate bathroom, if available.  Avoid sharing household items You should not share dishes, drinking glasses, cups, eating utensils, towels, bedding, or other items with other people in your home. After using these items, you should wash them thoroughly with soap and water.  Cover your coughs and sneezes Cover your mouth and nose with a tissue when you cough or sneeze, or you can cough or sneeze into your sleeve. Throw used tissues in a lined trash can, and immediately wash  your hands with soap and water for at least 20 seconds or use an alcohol-based hand rub.  Wash your [employer] Wash your hands often and thoroughly with soap and water for at least 20 seconds. You can use an alcohol-based hand sanitizer if soap and water are not available and if your hands are not visibly dirty. Avoid touching your eyes, nose, and mouth with unwashed hands.   Prevention Steps for Caregivers and Household Members of Individuals Confirmed to have, or Being Evaluated for, COVID-19 Infection Being Cared for in the Home  If you live with, or provide care at home for, a person confirmed to have, or being evaluated for, COVID-19 infection please follow these guidelines to prevent infection:  Follow healthcare provider's instructions Make sure that you understand and can help the patient follow any healthcare provider instructions for all care.  Provide for the patient's basic needs You should help the patient with basic needs  in the home and provide support for getting groceries, prescriptions, and other personal needs.  Monitor the patient's symptoms If they are getting sicker, call his or her medical provider and tell them that the patient has, or is being evaluated for, COVID-19 infection. This will help the healthcare provider's office take steps to keep other people from getting infected. Ask the healthcare provider to call the local or state health department.  Limit the number of people who have contact with the patient  If possible, have only one caregiver for the patient.  Other household members should stay in another home or place of residence. If this is not possible, they should stay  in another room, or be separated from the patient as much as possible. Use a separate bathroom, if available.  Restrict visitors who do not have an essential need to be in the home.  Keep older adults, very young children, and other sick people away from the patient Keep older  adults, very young children, and those who have compromised immune systems or chronic health conditions away from the patient. This includes people with chronic heart, lung, or kidney conditions, diabetes, and cancer.  Ensure good ventilation Make sure that shared spaces in the home have good air flow, such as from an air conditioner or an opened window, weather permitting.  Wash your hands often  Wash your hands often and thoroughly with soap and water for at least 20 seconds. You can use an alcohol based hand sanitizer if soap and water are not available and if your hands are not visibly dirty.  Avoid touching your eyes, nose, and mouth with unwashed hands.  Use disposable paper towels to dry your hands. If not available, use dedicated cloth towels and replace them when they become wet.  Wear a facemask and gloves  Wear a disposable facemask at all times in the room and gloves when you touch or have contact with the patient's blood, body fluids, and/or secretions or excretions, such as sweat, saliva, sputum, nasal mucus, vomit, urine, or feces.  Ensure the mask fits over your nose and mouth tightly, and do not touch it during use.  Throw out disposable facemasks and gloves after using them. Do not reuse.  Wash your hands immediately after removing your facemask and gloves.  If your personal clothing becomes contaminated, carefully remove clothing and launder. Wash your hands after handling contaminated clothing.  Place all used disposable facemasks, gloves, and other waste in a lined container before disposing them with other household waste.  Remove gloves and wash your hands immediately after handling these items.  Do not share dishes, glasses, or other household items with the patient  Avoid sharing household items. You should not share dishes, drinking glasses, cups, eating utensils, towels, bedding, or other items with a patient who is confirmed to have, or being evaluated for,  COVID-19 infection.  After the person uses these items, you should wash them thoroughly with soap and water.  Wash laundry thoroughly  Immediately remove and wash clothes or bedding that have blood, body fluids, and/or secretions or excretions, such as sweat, saliva, sputum, nasal mucus, vomit, urine, or feces, on them.  Wear gloves when handling laundry from the patient.  Read and follow directions on labels of laundry or clothing items and detergent. In general, wash and dry with the warmest temperatures recommended on the label.  Clean all areas the individual has used often  Clean all touchable surfaces, such as counters, tabletops, doorknobs, bathroom  fixtures, toilets, phones, keyboards, tablets, and bedside tables, every day. Also, clean any surfaces that may have blood, body fluids, and/or secretions or excretions on them.  Wear gloves when cleaning surfaces the patient has come in contact with.  Use a diluted bleach solution (e.g., dilute bleach with 1 part bleach and 10 parts water) or a household disinfectant with a label that says EPA-registered for coronaviruses. To make a bleach solution at home, add 1 tablespoon of bleach to 1 quart (4 cups) of water. For a larger supply, add  cup of bleach to 1 gallon (16 cups) of water.  Read labels of cleaning products and follow recommendations provided on product labels. Labels contain instructions for safe and effective use of the cleaning product including precautions you should take when applying the product, such as wearing gloves or eye protection and making sure you have good ventilation during use of the product.  Remove gloves and wash hands immediately after cleaning.  Monitor yourself for signs and symptoms of illness Caregivers and household members are considered close contacts, should monitor their health, and will be asked to limit movement outside of the home to the extent possible. Follow the monitoring steps for close  contacts listed on the symptom monitoring form.   ? If you have additional questions, contact your local health department or call the epidemiologist on call at [phone] (available 24/7). ? This guidance is subject to change. For the most up-to-date guidance from [facility], please refer to their website: [email or web address]     ED Prescriptions    None     Controlled Substance Prescriptions [place] Controlled Substance Registry consulted? Not Applicable   Raylene Everts, MD 12/08/18 1426

## 2018-12-08 NOTE — Discharge Instructions (Addendum)
Person Under Monitoring Name: Bailey Hines  Location: [place] [address]   Infection Prevention Recommendations for Individuals Confirmed to have, or Being Evaluated for, 2019 Novel Coronavirus (COVID-19) Infection Who Receive Care at Home  Individuals who are confirmed to have, or are being evaluated for, COVID-19 should follow the prevention steps below until a healthcare provider or local or state health department says they can return to normal activities.  Stay home except to get medical care You should restrict activities outside your home, except for getting medical care. Do not go to work, school, or public areas, and do not use public transportation or taxis.  Call ahead before visiting your doctor Before your medical appointment, call the healthcare provider and tell them that you have, or are being evaluated for, COVID-19 infection. This will help the healthcare providers office take steps to keep other people from getting infected. Ask your healthcare provider to call the local or state health department.  Monitor your symptoms Seek prompt medical attention if your illness is worsening (e.g., difficulty breathing). Before going to your medical appointment, call the healthcare provider and tell them that you have, or are being evaluated for, COVID-19 infection. Ask your healthcare provider to call the local or state health department.  Wear a facemask You should wear a facemask that covers your nose and mouth when you are in the same room with other people and when you visit a healthcare provider. People who live with or visit you should also wear a facemask while they are in the same room with you.  Separate yourself from other people in your home As much as possible, you should stay in a different room from other people in your home. Also, you should use a separate bathroom, if available.  Avoid sharing household items You should not  share dishes, drinking glasses, cups, eating utensils, towels, bedding, or other items with other people in your home. After using these items, you should wash them thoroughly with soap and water.  Cover your coughs and sneezes Cover your mouth and nose with a tissue when you cough or sneeze, or you can cough or sneeze into your sleeve. Throw used tissues in a lined trash can, and immediately wash your hands with soap and water for at least 20 seconds or use an alcohol-based hand rub.  Wash your [employer] your hands often and thoroughly with soap and water for at least 20 seconds. You can use an alcohol-based hand sanitizer if soap and water are not available and if your hands are not visibly dirty. Avoid touching your eyes, nose, and mouth with unwashed hands.   Prevention Steps for Caregivers and Household Members of Individuals Confirmed to have, or Being Evaluated for, COVID-19 Infection Being Cared for in the Home  If you live with, or provide care at home for, a person confirmed to have, or being evaluated for, COVID-19 infection please follow these guidelines to prevent infection:  Follow healthcare providers instructions Make sure that you understand and can help the patient follow any healthcare provider instructions for all care.  Provide for the patients basic needs You should help the patient with basic needs in the home and provide support for getting groceries, prescriptions, and other personal needs.  Monitor the patients symptoms If they are getting sicker, call his or her medical provider and tell them that the patient has, or is being evaluated for, COVID-19 infection. This will help the healthcare providers office  take steps to keep other people from getting infected. Ask the healthcare provider to call the local or state health department.  Limit the number of people who have contact with the patient If possible, have only one caregiver for the  patient. Other household members should stay in another home or place of residence. If this is not possible, they should stay in another room, or be separated from the patient as much as possible. Use a separate bathroom, if available. Restrict visitors who do not have an essential need to be in the home.  Keep older adults, very young children, and other sick people away from the patient Keep older adults, very young children, and those who have compromised immune systems or chronic health conditions away from the patient. This includes people with chronic heart, lung, or kidney conditions, diabetes, and cancer.  Ensure good ventilation Make sure that shared spaces in the home have good air flow, such as from an air conditioner or an opened window, weather permitting.  Wash your hands often Wash your hands often and thoroughly with soap and water for at least 20 seconds. You can use an alcohol based hand sanitizer if soap and water are not available and if your hands are not visibly dirty. Avoid touching your eyes, nose, and mouth with unwashed hands. Use disposable paper towels to dry your hands. If not available, use dedicated cloth towels and replace them when they become wet.  Wear a facemask and gloves Wear a disposable facemask at all times in the room and gloves when you touch or have contact with the patients blood, body fluids, and/or secretions or excretions, such as sweat, saliva, sputum, nasal mucus, vomit, urine, or feces.  Ensure the mask fits over your nose and mouth tightly, and do not touch it during use. Throw out disposable facemasks and gloves after using them. Do not reuse. Wash your hands immediately after removing your facemask and gloves. If your personal clothing becomes contaminated, carefully remove clothing and launder. Wash your hands after handling contaminated clothing. Place all used disposable facemasks, gloves, and other waste in a lined container before  disposing them with other household waste. Remove gloves and wash your hands immediately after handling these items.  Do not share dishes, glasses, or other household items with the patient Avoid sharing household items. You should not share dishes, drinking glasses, cups, eating utensils, towels, bedding, or other items with a patient who is confirmed to have, or being evaluated for, COVID-19 infection. After the person uses these items, you should wash them thoroughly with soap and water.  Wash laundry thoroughly Immediately remove and wash clothes or bedding that have blood, body fluids, and/or secretions or excretions, such as sweat, saliva, sputum, nasal mucus, vomit, urine, or feces, on them. Wear gloves when handling laundry from the patient. Read and follow directions on labels of laundry or clothing items and detergent. In general, wash and dry with the warmest temperatures recommended on the label.  Clean all areas the individual has used often Clean all touchable surfaces, such as counters, tabletops, doorknobs, bathroom fixtures, toilets, phones, keyboards, tablets, and bedside tables, every day. Also, clean any surfaces that may have blood, body fluids, and/or secretions or excretions on them. Wear gloves when cleaning surfaces the patient has come in contact with. Use a diluted bleach solution (e.g., dilute bleach with 1 part bleach and 10 parts water) or a household disinfectant with a label that says EPA-registered for coronaviruses. To make a bleach  solution at home, add 1 tablespoon of bleach to 1 quart (4 cups) of water. For a larger supply, add  cup of bleach to 1 gallon (16 cups) of water. Read labels of cleaning products and follow recommendations provided on product labels. Labels contain instructions for safe and effective use of the cleaning product including precautions you should take when applying the product, such as wearing gloves or eye protection and making sure you  have good ventilation during use of the product. Remove gloves and wash hands immediately after cleaning.  Monitor yourself for signs and symptoms of illness Caregivers and household members are considered close contacts, should monitor their health, and will be asked to limit movement outside of the home to the extent possible. Follow the monitoring steps for close contacts listed on the symptom monitoring form.   ? If you have additional questions, contact your local health department or call the epidemiologist on call at [phone] (available 24/7). ? This guidance is subject to change. For the most up-to-date guidance from [facility], please refer to their website: [email or web address]

## 2018-12-08 NOTE — ED Triage Notes (Signed)
Pt presents for Covid test after an exposure; no symptoms.

## 2018-12-09 LAB — NOVEL CORONAVIRUS, NAA (HOSP ORDER, SEND-OUT TO REF LAB; TAT 18-24 HRS): SARS-CoV-2, NAA: NOT DETECTED

## 2018-12-10 ENCOUNTER — Telehealth (HOSPITAL_COMMUNITY): Payer: Self-pay | Admitting: Emergency Medicine

## 2018-12-10 NOTE — Telephone Encounter (Signed)
Pt called about covid results, results given. All questions answered.

## 2019-03-02 ENCOUNTER — Other Ambulatory Visit: Payer: Self-pay

## 2019-03-02 DIAGNOSIS — Z20822 Contact with and (suspected) exposure to covid-19: Secondary | ICD-10-CM

## 2019-03-03 LAB — NOVEL CORONAVIRUS, NAA: SARS-CoV-2, NAA: NOT DETECTED

## 2019-03-10 ENCOUNTER — Other Ambulatory Visit: Payer: Self-pay | Admitting: Family Medicine

## 2019-03-10 DIAGNOSIS — Z1231 Encounter for screening mammogram for malignant neoplasm of breast: Secondary | ICD-10-CM

## 2019-05-04 ENCOUNTER — Ambulatory Visit
Admission: RE | Admit: 2019-05-04 | Discharge: 2019-05-04 | Disposition: A | Discharge status: Home / Self Care | Payer: Medicare PPO | Source: Ambulatory Visit | Attending: Family Medicine | Admitting: Family Medicine

## 2019-05-04 ENCOUNTER — Other Ambulatory Visit: Payer: Self-pay

## 2019-05-04 DIAGNOSIS — Z1231 Encounter for screening mammogram for malignant neoplasm of breast: Secondary | ICD-10-CM

## 2019-08-21 ENCOUNTER — Other Ambulatory Visit: Payer: Self-pay | Admitting: Family Medicine

## 2019-08-21 DIAGNOSIS — E2839 Other primary ovarian failure: Secondary | ICD-10-CM

## 2019-10-29 ENCOUNTER — Other Ambulatory Visit: Payer: Self-pay

## 2019-10-29 ENCOUNTER — Ambulatory Visit
Admission: RE | Admit: 2019-10-29 | Discharge: 2019-10-29 | Disposition: A | Discharge status: Home / Self Care | Payer: Medicare PPO | Source: Ambulatory Visit | Attending: Family Medicine | Admitting: Family Medicine

## 2019-10-29 DIAGNOSIS — E2839 Other primary ovarian failure: Secondary | ICD-10-CM

## 2019-10-29 DIAGNOSIS — Z780 Asymptomatic menopausal state: Secondary | ICD-10-CM | POA: Diagnosis not present

## 2019-12-18 DIAGNOSIS — H25013 Cortical age-related cataract, bilateral: Secondary | ICD-10-CM | POA: Diagnosis not present

## 2019-12-18 DIAGNOSIS — H16223 Keratoconjunctivitis sicca, not specified as Sjogren's, bilateral: Secondary | ICD-10-CM | POA: Diagnosis not present

## 2019-12-18 DIAGNOSIS — H2513 Age-related nuclear cataract, bilateral: Secondary | ICD-10-CM | POA: Diagnosis not present

## 2019-12-25 ENCOUNTER — Other Ambulatory Visit: Payer: Self-pay

## 2019-12-25 ENCOUNTER — Other Ambulatory Visit: Payer: Medicare PPO

## 2019-12-25 DIAGNOSIS — Z20822 Contact with and (suspected) exposure to covid-19: Secondary | ICD-10-CM

## 2019-12-27 LAB — SARS-COV-2, NAA 2 DAY TAT

## 2019-12-27 LAB — NOVEL CORONAVIRUS, NAA: SARS-CoV-2, NAA: NOT DETECTED

## 2020-01-19 ENCOUNTER — Ambulatory Visit: Payer: Medicare PPO

## 2020-01-20 DIAGNOSIS — Z23 Encounter for immunization: Secondary | ICD-10-CM | POA: Diagnosis not present

## 2020-02-19 DIAGNOSIS — I10 Essential (primary) hypertension: Secondary | ICD-10-CM | POA: Diagnosis not present

## 2020-02-19 DIAGNOSIS — E785 Hyperlipidemia, unspecified: Secondary | ICD-10-CM | POA: Diagnosis not present

## 2020-02-22 ENCOUNTER — Ambulatory Visit: Payer: Medicare PPO | Attending: Internal Medicine

## 2020-02-22 DIAGNOSIS — Z23 Encounter for immunization: Secondary | ICD-10-CM

## 2020-02-22 NOTE — Progress Notes (Signed)
   Covid-19 Vaccination Clinic  Name:  Bailey Hines    MRN: [id number] DOB: 09-29-1945  02/22/2020  Bailey Hines was observed post Covid-19 immunization for 15 minutes without incident. She was provided with Vaccine Information Sheet and instruction to access the V-Safe system.   Bailey Hines was instructed to call 911 with any severe reactions post vaccine: Marland Kitchen Difficulty breathing  . Swelling of face and throat  . A fast heartbeat  . A bad rash all over body  . Dizziness and weakness

## 2020-03-21 ENCOUNTER — Other Ambulatory Visit: Payer: Self-pay | Admitting: Family Medicine

## 2020-03-21 DIAGNOSIS — Z1231 Encounter for screening mammogram for malignant neoplasm of breast: Secondary | ICD-10-CM

## 2020-05-09 ENCOUNTER — Other Ambulatory Visit: Payer: Self-pay

## 2020-05-09 ENCOUNTER — Ambulatory Visit
Admission: RE | Admit: 2020-05-09 | Discharge: 2020-05-09 | Disposition: A | Discharge status: Home / Self Care | Payer: Medicare PPO | Source: Ambulatory Visit | Attending: Family Medicine | Admitting: Family Medicine

## 2020-05-09 DIAGNOSIS — Z1231 Encounter for screening mammogram for malignant neoplasm of breast: Secondary | ICD-10-CM

## 2020-05-12 DIAGNOSIS — Z03818 Encounter for observation for suspected exposure to other biological agents ruled out: Secondary | ICD-10-CM | POA: Diagnosis not present

## 2020-05-13 ENCOUNTER — Other Ambulatory Visit: Payer: Medicare PPO

## 2020-08-30 DIAGNOSIS — Z23 Encounter for immunization: Secondary | ICD-10-CM | POA: Diagnosis not present

## 2020-08-30 DIAGNOSIS — I10 Essential (primary) hypertension: Secondary | ICD-10-CM | POA: Diagnosis not present

## 2020-08-30 DIAGNOSIS — Z0000 Encounter for general adult medical examination without abnormal findings: Secondary | ICD-10-CM | POA: Diagnosis not present

## 2020-08-30 DIAGNOSIS — A6000 Herpesviral infection of urogenital system, unspecified: Secondary | ICD-10-CM | POA: Diagnosis not present

## 2020-08-30 DIAGNOSIS — E785 Hyperlipidemia, unspecified: Secondary | ICD-10-CM | POA: Diagnosis not present

## 2020-10-11 DIAGNOSIS — M545 Low back pain, unspecified: Secondary | ICD-10-CM | POA: Diagnosis not present

## 2020-10-11 DIAGNOSIS — S39012A Strain of muscle, fascia and tendon of lower back, initial encounter: Secondary | ICD-10-CM | POA: Diagnosis not present

## 2020-11-22 DIAGNOSIS — U071 COVID-19: Secondary | ICD-10-CM | POA: Diagnosis not present

## 2020-11-23 DIAGNOSIS — U071 COVID-19: Secondary | ICD-10-CM | POA: Diagnosis not present

## 2020-12-01 DIAGNOSIS — Z03818 Encounter for observation for suspected exposure to other biological agents ruled out: Secondary | ICD-10-CM | POA: Diagnosis not present

## 2020-12-14 DIAGNOSIS — U071 COVID-19: Secondary | ICD-10-CM | POA: Diagnosis not present

## 2020-12-14 DIAGNOSIS — R059 Cough, unspecified: Secondary | ICD-10-CM | POA: Diagnosis not present

## 2021-02-03 ENCOUNTER — Other Ambulatory Visit: Payer: Self-pay | Admitting: Family Medicine

## 2021-02-03 DIAGNOSIS — Z1231 Encounter for screening mammogram for malignant neoplasm of breast: Secondary | ICD-10-CM

## 2021-02-08 DIAGNOSIS — Z23 Encounter for immunization: Secondary | ICD-10-CM | POA: Diagnosis not present

## 2021-02-15 DIAGNOSIS — Z20828 Contact with and (suspected) exposure to other viral communicable diseases: Secondary | ICD-10-CM | POA: Diagnosis not present

## 2021-03-02 DIAGNOSIS — J301 Allergic rhinitis due to pollen: Secondary | ICD-10-CM | POA: Diagnosis not present

## 2021-03-02 DIAGNOSIS — I10 Essential (primary) hypertension: Secondary | ICD-10-CM | POA: Diagnosis not present

## 2021-03-02 DIAGNOSIS — E785 Hyperlipidemia, unspecified: Secondary | ICD-10-CM | POA: Diagnosis not present

## 2021-03-17 DIAGNOSIS — H0288B Meibomian gland dysfunction left eye, upper and lower eyelids: Secondary | ICD-10-CM | POA: Diagnosis not present

## 2021-03-17 DIAGNOSIS — H2513 Age-related nuclear cataract, bilateral: Secondary | ICD-10-CM | POA: Diagnosis not present

## 2021-03-17 DIAGNOSIS — H0288A Meibomian gland dysfunction right eye, upper and lower eyelids: Secondary | ICD-10-CM | POA: Diagnosis not present

## 2021-03-17 DIAGNOSIS — H25013 Cortical age-related cataract, bilateral: Secondary | ICD-10-CM | POA: Diagnosis not present

## 2021-05-10 ENCOUNTER — Ambulatory Visit
Admission: RE | Admit: 2021-05-10 | Discharge: 2021-05-10 | Disposition: A | Discharge status: Home / Self Care | Payer: Medicare PPO | Source: Ambulatory Visit | Attending: Family Medicine | Admitting: Family Medicine

## 2021-05-10 DIAGNOSIS — Z1231 Encounter for screening mammogram for malignant neoplasm of breast: Secondary | ICD-10-CM

## 2021-08-24 DIAGNOSIS — M545 Low back pain, unspecified: Secondary | ICD-10-CM | POA: Diagnosis not present

## 2021-10-03 DIAGNOSIS — M545 Low back pain, unspecified: Secondary | ICD-10-CM | POA: Diagnosis not present

## 2021-10-03 DIAGNOSIS — M2569 Stiffness of other specified joint, not elsewhere classified: Secondary | ICD-10-CM | POA: Diagnosis not present

## 2021-10-03 DIAGNOSIS — M62551 Muscle wasting and atrophy, not elsewhere classified, right thigh: Secondary | ICD-10-CM | POA: Diagnosis not present

## 2021-10-03 DIAGNOSIS — M62552 Muscle wasting and atrophy, not elsewhere classified, left thigh: Secondary | ICD-10-CM | POA: Diagnosis not present

## 2021-10-06 DIAGNOSIS — M62552 Muscle wasting and atrophy, not elsewhere classified, left thigh: Secondary | ICD-10-CM | POA: Diagnosis not present

## 2021-10-06 DIAGNOSIS — M62551 Muscle wasting and atrophy, not elsewhere classified, right thigh: Secondary | ICD-10-CM | POA: Diagnosis not present

## 2021-10-06 DIAGNOSIS — M2569 Stiffness of other specified joint, not elsewhere classified: Secondary | ICD-10-CM | POA: Diagnosis not present

## 2021-10-06 DIAGNOSIS — M545 Low back pain, unspecified: Secondary | ICD-10-CM | POA: Diagnosis not present

## 2021-10-10 DIAGNOSIS — M62551 Muscle wasting and atrophy, not elsewhere classified, right thigh: Secondary | ICD-10-CM | POA: Diagnosis not present

## 2021-10-10 DIAGNOSIS — M545 Low back pain, unspecified: Secondary | ICD-10-CM | POA: Diagnosis not present

## 2021-10-10 DIAGNOSIS — M62552 Muscle wasting and atrophy, not elsewhere classified, left thigh: Secondary | ICD-10-CM | POA: Diagnosis not present

## 2021-10-10 DIAGNOSIS — M2569 Stiffness of other specified joint, not elsewhere classified: Secondary | ICD-10-CM | POA: Diagnosis not present

## 2021-10-12 DIAGNOSIS — M62551 Muscle wasting and atrophy, not elsewhere classified, right thigh: Secondary | ICD-10-CM | POA: Diagnosis not present

## 2021-10-12 DIAGNOSIS — M2569 Stiffness of other specified joint, not elsewhere classified: Secondary | ICD-10-CM | POA: Diagnosis not present

## 2021-10-12 DIAGNOSIS — M545 Low back pain, unspecified: Secondary | ICD-10-CM | POA: Diagnosis not present

## 2021-10-12 DIAGNOSIS — M62552 Muscle wasting and atrophy, not elsewhere classified, left thigh: Secondary | ICD-10-CM | POA: Diagnosis not present

## 2021-10-17 DIAGNOSIS — M62551 Muscle wasting and atrophy, not elsewhere classified, right thigh: Secondary | ICD-10-CM | POA: Diagnosis not present

## 2021-10-17 DIAGNOSIS — M2569 Stiffness of other specified joint, not elsewhere classified: Secondary | ICD-10-CM | POA: Diagnosis not present

## 2021-10-17 DIAGNOSIS — M545 Low back pain, unspecified: Secondary | ICD-10-CM | POA: Diagnosis not present

## 2021-10-17 DIAGNOSIS — M62552 Muscle wasting and atrophy, not elsewhere classified, left thigh: Secondary | ICD-10-CM | POA: Diagnosis not present

## 2021-10-19 DIAGNOSIS — M2569 Stiffness of other specified joint, not elsewhere classified: Secondary | ICD-10-CM | POA: Diagnosis not present

## 2021-10-19 DIAGNOSIS — M62552 Muscle wasting and atrophy, not elsewhere classified, left thigh: Secondary | ICD-10-CM | POA: Diagnosis not present

## 2021-10-19 DIAGNOSIS — M545 Low back pain, unspecified: Secondary | ICD-10-CM | POA: Diagnosis not present

## 2021-10-19 DIAGNOSIS — M62551 Muscle wasting and atrophy, not elsewhere classified, right thigh: Secondary | ICD-10-CM | POA: Diagnosis not present

## 2021-10-24 DIAGNOSIS — M2569 Stiffness of other specified joint, not elsewhere classified: Secondary | ICD-10-CM | POA: Diagnosis not present

## 2021-10-24 DIAGNOSIS — M62552 Muscle wasting and atrophy, not elsewhere classified, left thigh: Secondary | ICD-10-CM | POA: Diagnosis not present

## 2021-10-24 DIAGNOSIS — M62551 Muscle wasting and atrophy, not elsewhere classified, right thigh: Secondary | ICD-10-CM | POA: Diagnosis not present

## 2021-10-24 DIAGNOSIS — M545 Low back pain, unspecified: Secondary | ICD-10-CM | POA: Diagnosis not present

## 2021-10-26 DIAGNOSIS — M62551 Muscle wasting and atrophy, not elsewhere classified, right thigh: Secondary | ICD-10-CM | POA: Diagnosis not present

## 2021-10-26 DIAGNOSIS — M545 Low back pain, unspecified: Secondary | ICD-10-CM | POA: Diagnosis not present

## 2021-10-26 DIAGNOSIS — M62552 Muscle wasting and atrophy, not elsewhere classified, left thigh: Secondary | ICD-10-CM | POA: Diagnosis not present

## 2021-10-26 DIAGNOSIS — M2569 Stiffness of other specified joint, not elsewhere classified: Secondary | ICD-10-CM | POA: Diagnosis not present

## 2021-11-02 DIAGNOSIS — M62552 Muscle wasting and atrophy, not elsewhere classified, left thigh: Secondary | ICD-10-CM | POA: Diagnosis not present

## 2021-11-02 DIAGNOSIS — M62551 Muscle wasting and atrophy, not elsewhere classified, right thigh: Secondary | ICD-10-CM | POA: Diagnosis not present

## 2021-11-02 DIAGNOSIS — M2569 Stiffness of other specified joint, not elsewhere classified: Secondary | ICD-10-CM | POA: Diagnosis not present

## 2021-11-02 DIAGNOSIS — M545 Low back pain, unspecified: Secondary | ICD-10-CM | POA: Diagnosis not present

## 2021-12-31 DIAGNOSIS — R42 Dizziness and giddiness: Secondary | ICD-10-CM | POA: Diagnosis not present

## 2022-01-02 DIAGNOSIS — R42 Dizziness and giddiness: Secondary | ICD-10-CM | POA: Diagnosis not present

## 2022-01-02 DIAGNOSIS — I10 Essential (primary) hypertension: Secondary | ICD-10-CM | POA: Diagnosis not present

## 2022-01-02 DIAGNOSIS — R000 Tachycardia, unspecified: Secondary | ICD-10-CM | POA: Diagnosis not present

## 2022-01-15 DIAGNOSIS — I10 Essential (primary) hypertension: Secondary | ICD-10-CM | POA: Diagnosis not present

## 2022-01-15 DIAGNOSIS — E785 Hyperlipidemia, unspecified: Secondary | ICD-10-CM | POA: Diagnosis not present

## 2022-01-15 DIAGNOSIS — R051 Acute cough: Secondary | ICD-10-CM | POA: Diagnosis not present

## 2022-02-08 DIAGNOSIS — I10 Essential (primary) hypertension: Secondary | ICD-10-CM | POA: Diagnosis not present

## 2022-02-08 DIAGNOSIS — E785 Hyperlipidemia, unspecified: Secondary | ICD-10-CM | POA: Diagnosis not present

## 2022-02-12 DIAGNOSIS — U099 Post covid-19 condition, unspecified: Secondary | ICD-10-CM | POA: Diagnosis not present

## 2022-02-12 DIAGNOSIS — E785 Hyperlipidemia, unspecified: Secondary | ICD-10-CM | POA: Diagnosis not present

## 2022-02-12 DIAGNOSIS — I10 Essential (primary) hypertension: Secondary | ICD-10-CM | POA: Diagnosis not present

## 2022-02-12 DIAGNOSIS — A6000 Herpesviral infection of urogenital system, unspecified: Secondary | ICD-10-CM | POA: Diagnosis not present

## 2022-02-12 DIAGNOSIS — R053 Chronic cough: Secondary | ICD-10-CM | POA: Diagnosis not present

## 2022-02-12 DIAGNOSIS — J301 Allergic rhinitis due to pollen: Secondary | ICD-10-CM | POA: Diagnosis not present

## 2022-02-12 DIAGNOSIS — Z0000 Encounter for general adult medical examination without abnormal findings: Secondary | ICD-10-CM | POA: Diagnosis not present

## 2022-02-12 DIAGNOSIS — Z23 Encounter for immunization: Secondary | ICD-10-CM | POA: Diagnosis not present

## 2022-03-30 ENCOUNTER — Other Ambulatory Visit: Payer: Self-pay

## 2022-03-30 DIAGNOSIS — Z1231 Encounter for screening mammogram for malignant neoplasm of breast: Secondary | ICD-10-CM

## 2022-05-24 ENCOUNTER — Ambulatory Visit
Admission: RE | Admit: 2022-05-24 | Discharge: 2022-05-24 | Disposition: A | Discharge status: Home / Self Care | Payer: TRICARE For Life (TFL) | Source: Ambulatory Visit | Attending: Family Medicine | Admitting: Family Medicine

## 2022-05-24 DIAGNOSIS — Z1231 Encounter for screening mammogram for malignant neoplasm of breast: Secondary | ICD-10-CM

## 2022-06-21 DIAGNOSIS — H2513 Age-related nuclear cataract, bilateral: Secondary | ICD-10-CM | POA: Diagnosis not present

## 2022-06-21 DIAGNOSIS — H25041 Posterior subcapsular polar age-related cataract, right eye: Secondary | ICD-10-CM | POA: Diagnosis not present

## 2022-06-21 DIAGNOSIS — H25013 Cortical age-related cataract, bilateral: Secondary | ICD-10-CM | POA: Diagnosis not present

## 2022-08-23 DIAGNOSIS — R8290 Unspecified abnormal findings in urine: Secondary | ICD-10-CM | POA: Diagnosis not present

## 2022-08-23 DIAGNOSIS — I10 Essential (primary) hypertension: Secondary | ICD-10-CM | POA: Diagnosis not present

## 2022-08-23 DIAGNOSIS — E785 Hyperlipidemia, unspecified: Secondary | ICD-10-CM | POA: Diagnosis not present

## 2022-08-23 DIAGNOSIS — L659 Nonscarring hair loss, unspecified: Secondary | ICD-10-CM | POA: Diagnosis not present

## 2022-08-24 DIAGNOSIS — I10 Essential (primary) hypertension: Secondary | ICD-10-CM | POA: Diagnosis not present

## 2022-08-24 DIAGNOSIS — E785 Hyperlipidemia, unspecified: Secondary | ICD-10-CM | POA: Diagnosis not present

## 2022-08-24 DIAGNOSIS — L659 Nonscarring hair loss, unspecified: Secondary | ICD-10-CM | POA: Diagnosis not present

## 2022-11-12 ENCOUNTER — Ambulatory Visit (INDEPENDENT_AMBULATORY_CARE_PROVIDER_SITE_OTHER): Payer: Medicare PPO | Admitting: Dermatology

## 2022-11-12 VITALS — BP 123/79

## 2022-11-12 DIAGNOSIS — L219 Seborrheic dermatitis, unspecified: Secondary | ICD-10-CM

## 2022-11-12 DIAGNOSIS — L668 Other cicatricial alopecia: Secondary | ICD-10-CM | POA: Diagnosis not present

## 2022-11-12 MED ORDER — SAFETY SEAL MISCELLANEOUS MISC: 1.0000 | 3 refills | Status: DC

## 2022-11-12 NOTE — Patient Instructions (Addendum)
Thank you for visiting my office today. I appreciate your commitment to addressing your scalp and hair concerns. Here is a summary of our discussion and the treatment plan we have outlined for your condition:  - Seborrheic Dermatitis Management:   - Use DHS Zinc shampoo once a week for the next month, then transition to every two weeks. This shampoo contains 2% zinc and should help manage your symptoms effectively.  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 Start every other morning for the first two weeks, then adjust as needed based on your symptoms.   [facility] will prepare and send your prescription. They will contact you to confirm your address.  - Supplements:   - Viviscal tablets: Take two tablets daily, either both in the morning or one in the morning and one at night.   - Collagen (Vital Proteins): Recommended for additional support in hair, skin, and nail health.  - Follow-Up:   - We will take photos today to monitor progress and compare them in four months to assess the effectiveness of the treatment.  Please ensure to follow the instructions carefully and do not hesitate to contact my office if you have any questions or concerns during your treatment.            Due to recent changes in healthcare laws, you may see results of your pathology and/or laboratory studies on MyChart before the doctors have had a chance to review them. We understand that in some cases there may be results that are confusing or concerning to you. Please understand that not all results are received at the same time and often the doctors may need to interpret multiple results in order to provide you with the best plan of care or course of treatment. Therefore, we ask that you  please give [place] 2 business days to thoroughly review all your results before contacting the office for clarification. Should we see a critical lab result, you will be contacted sooner.   If You Need Anything After Your Visit  If you have any questions or concerns for your doctor, please call our main line at [phone] If no one answers, please leave a voicemail as directed and we will return your call as soon as possible. Messages left after 4 pm will be answered the following business day.   You may also send [place] a message via MyChart. We typically respond to MyChart messages within 1-2 business days.  For prescription refills, please ask your pharmacy to contact our office. Our fax number is [phone].  If you have an urgent issue when the clinic is closed that cannot wait until the next business day, you can page your doctor at the number below.    Please note that while we do our best to be available for urgent issues outside of office hours, we are not available 24/7.   If you have an urgent issue and are unable to reach [place], you may choose to seek medical care at your doctor's office, retail clinic, urgent care center, or emergency room.  If you have a medical emergency, please immediately call 911 or go to the emergency department. In the event of inclement weather, please call our main line at [phone] for an update on the status of any delays or closures.  Dermatology Medication Tips: Please keep the boxes that topical medications come in in order to help keep track of the instructions about where and how to use these. Pharmacies typically print the medication instructions only on the boxes and not directly on the medication tubes.   If your medication is too expensive, please contact our office at [phone] or send [place] a message through MyChart.   We are unable to tell what your co-pay for medications will be in advance as this is different depending on your insurance  coverage. However, we may be able to find a substitute medication at lower cost or fill out paperwork to get insurance to cover a needed medication.   If a prior authorization is required to get your medication covered by your insurance company, please allow [place] 1-2 business days to complete this process.  Drug prices often vary depending on where the prescription is filled and some pharmacies may offer cheaper prices.  The website www.goodrx.com contains coupons for medications through different pharmacies. The prices here do not account for what the cost may be with help from insurance (it may be cheaper with your insurance), but the website can give you the price if you did not use any insurance.  - You can print the associated coupon and take it with your prescription to the pharmacy.  - You may also stop by our office during regular business hours and pick up a GoodRx coupon card.  - If you need your prescription sent electronically to a different pharmacy, notify our office through [facility] or by phone at [phone]

## 2022-11-12 NOTE — Progress Notes (Signed)
   New Patient Visit   Subjective  Bailey Hines is a 77 y.o. female who presents for the following: Rash that is itchy and flaking x 2-3 years. She had used Biotin oil and shampoo kit for hair growth which did not help. She noticed hair loss with new products. She had hair extensions  before Covid and noticed irritation, and flaking from it.  Weave was braided in. She washes her hair every 2 weeks. She was taking [place] Hair supplement but stopped taking it one month ago because she was fearful of it possibly causing the problem.    The following portions of the chart were reviewed this encounter and updated as appropriate: medications, allergies, medical history  Review of Systems:  No other skin or systemic complaints except as noted in HPI or Assessment and Plan.  Objective  Well appearing patient in no apparent distress; mood and affect are within normal limits.  A focused examination was performed of the following areas: scalp               Relevant exam    Assessment & Plan     SEBORRHEIC DERMATITIS Exam: Pink patches with greasy scale at the scalp  Flared  Seborrheic Dermatitis is a chronic persistent rash characterized by pinkness and scaling most commonly of the mid face but also can occur on the scalp (dandruff), ears; mid chest, mid back and groin.  It tends to be exacerbated by stress and cooler weather.  People who have neurologic disease may experience new onset or exacerbation of existing seborrheic dermatitis.  The condition is not curable but treatable and can be controlled.  Treatment Plan: Discussed using scalp oils less frequently. When using oils allow it to remain on scalp for a few hours then wash out. DHS Zinc Shampoo. Allow it to remain on scalp for 2-3 minutes then rinse. Regular shampoo can be used with it.  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 Triggering or aggravation of the disease may occur following traumatic hair care practices, such as cornrows and braiding, extensions, weaves with sewn-in or glued-on hair, use of hot combs, and frequent use of hair relaxers. These practices should be discontinued to slow progression of disease. Therapies may stop or slow progression but generally do not lead to hair regrowth in scarred areas.      Return in about 4 months (around 03/15/2023) for Seborrheic Dermatitis Follow Up, Alopecia Follow Up.  Bailey Hines, CMA, am acting as scribe for [employer], DO.   Documentation: I have reviewed the above documentation for accuracy and completeness, and I agree with the above.  Langston Reusing, DO

## 2022-11-15 ENCOUNTER — Encounter: Payer: Self-pay | Admitting: Dermatology

## 2022-11-27 ENCOUNTER — Encounter: Payer: Self-pay | Admitting: Dermatology

## 2023-02-19 ENCOUNTER — Other Ambulatory Visit: Payer: Self-pay | Admitting: Family Medicine

## 2023-02-19 DIAGNOSIS — Z1231 Encounter for screening mammogram for malignant neoplasm of breast: Secondary | ICD-10-CM

## 2023-02-28 DIAGNOSIS — E785 Hyperlipidemia, unspecified: Secondary | ICD-10-CM | POA: Diagnosis not present

## 2023-02-28 DIAGNOSIS — N8111 Cystocele, midline: Secondary | ICD-10-CM | POA: Diagnosis not present

## 2023-02-28 DIAGNOSIS — Z0000 Encounter for general adult medical examination without abnormal findings: Secondary | ICD-10-CM | POA: Diagnosis not present

## 2023-02-28 DIAGNOSIS — I10 Essential (primary) hypertension: Secondary | ICD-10-CM | POA: Diagnosis not present

## 2023-02-28 DIAGNOSIS — Z23 Encounter for immunization: Secondary | ICD-10-CM | POA: Diagnosis not present

## 2023-02-28 DIAGNOSIS — A6000 Herpesviral infection of urogenital system, unspecified: Secondary | ICD-10-CM | POA: Diagnosis not present

## 2023-03-04 ENCOUNTER — Ambulatory Visit: Payer: TRICARE For Life (TFL) | Admitting: Dermatology

## 2023-03-07 ENCOUNTER — Encounter: Payer: Self-pay | Admitting: Dermatology

## 2023-03-07 ENCOUNTER — Ambulatory Visit (INDEPENDENT_AMBULATORY_CARE_PROVIDER_SITE_OTHER): Payer: Medicare PPO | Admitting: Dermatology

## 2023-03-07 DIAGNOSIS — L219 Seborrheic dermatitis, unspecified: Secondary | ICD-10-CM

## 2023-03-07 DIAGNOSIS — L6681 Central centrifugal cicatricial alopecia: Secondary | ICD-10-CM

## 2023-03-07 MED ORDER — SAFETY SEAL MISCELLANEOUS MISC: 1.0000 | 3 refills | Status: DC

## 2023-03-07 NOTE — Patient Instructions (Addendum)
Hello Ms. Bailey Hines,  Thank you for visiting [place] today. Your dedication to improving your scalp health is commendable, and we are encouraged by the progress you've made. Below is a summary of the key instructions from today's consultation:  - Scalp Gel Application:   - Preparation: Ensure your scalp and hair are dry before application.   - Monitoring: If a burning sensation persists, please inform us.  - Hair Care Products:   - Shampoo: Continue using the DHS Zinc shampoo once a week or alternate with every other shampoo.   - Oil: Use rosemary oil as needed for comfort.  - Supplements and Nutrition:   - Continuation: Keep taking Viviscal supplements, vital protein powder, and fish oil.  - Medication:   - Refill: Your prescription for the clobetasol minoxidil combination will be refilled at [facility] pharmacy. Expect contact from the pharmacy for refills.  - Follow-Up: We will see you in four months for further evaluation and to continue monitoring your progress.  Should you have any questions or require further assistance before your next appointment, please do not hesitate to contact our office.  Warm regards,  Dr. Langston Reusing Dermatology        Important Information  Due to recent changes in healthcare laws, you may see results of your pathology and/or laboratory studies on MyChart before the doctors have had a chance to review them. We understand that in some cases there may be results that are confusing or concerning to you. Please understand that not all results are received at the same time and often the doctors may need to interpret multiple results in order to provide you with the best plan of care or course of treatment. Therefore, we ask that you please give [place] 2 business days to thoroughly review all your results before contacting the office for clarification. Should we see a critical lab result, you will be contacted sooner.   If You Need Anything After Your Visit  If  you have any questions or concerns for your doctor, please call our main line at [phone] If no one answers, please leave a voicemail as directed and we will return your call as soon as possible. Messages left after 4 pm will be answered the following business day.   You may also send [place] a message via MyChart. We typically respond to MyChart messages within 1-2 business days.  For prescription refills, please ask your pharmacy to contact our office. Our fax number is [phone].  If you have an urgent issue when the clinic is closed that cannot wait until the next business day, you can page your doctor at the number below.    Please note that while we do our best to be available for urgent issues outside of office hours, we are not available 24/7.   If you have an urgent issue and are unable to reach [place], you may choose to seek medical care at your doctor's office, retail clinic, urgent care center, or emergency room.  If you have a medical emergency, please immediately call 911 or go to the emergency department. In the event of inclement weather, please call our main line at [phone] for an update on the status of any delays or closures.  Dermatology Medication Tips: Please keep the boxes that topical medications come in in order to help keep track of the instructions about where and how to use these. Pharmacies typically print the medication instructions only on the boxes and not directly on the medication tubes.  If your medication is too expensive, please contact our office at [phone] or send [place] a message through MyChart.   We are unable to tell what your co-pay for medications will be in advance as this is different depending on your insurance coverage. However, we may be able to find a substitute medication at lower cost or fill out paperwork to get insurance to cover a needed medication.   If a prior authorization is required to get your medication covered by your  insurance company, please allow [place] 1-2 business days to complete this process.  Drug prices often vary depending on where the prescription is filled and some pharmacies may offer cheaper prices.  The website www.goodrx.com contains coupons for medications through different pharmacies. The prices here do not account for what the cost may be with help from insurance (it may be cheaper with your insurance), but the website can give you the price if you did not use any insurance.  - You can print the associated coupon and take it with your prescription to the pharmacy.  - You may also stop by our office during regular business hours and pick up a GoodRx coupon card.  - If you need your prescription sent electronically to a different pharmacy, notify our office through [facility] or by phone at [phone]

## 2023-03-07 NOTE — Progress Notes (Signed)
   Follow-Up Visit   Subjective  Bailey Hines is a 77 y.o. female who presents for the following: Seb Derm & Alopecia  Patient present today for follow up visit for follow up. Patient was last evaluated on 11/12/22. She has been using the Minoxidil/Clobetasol compounded solution (AA Gel) and DHS Zinc shampoo. Patient reports sxs are better. Patient denies medication changes.  The following portions of the chart were reviewed this encounter and updated as appropriate: medications, allergies, medical history  Review of Systems:  No other skin or systemic complaints except as noted in HPI or Assessment and Plan.  Objective  Well appearing patient in no apparent distress; mood and affect are within normal limits.     A focused examination was performed of the following areas:   Relevant exam findings are noted in the Assessment and Plan.                      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 Pt informed to make sure scalp is completely dry prior to application to prevent "burning" sensation as it would if pores were open.    Central Centrifugal Cicatricial Alopecia (CCCA) is a chronic/progressive and irreversible patterned form of scarring alopecia that most commonly affects middle-aged women of African descent that presents with progressive hair loss, starting as a single patch at the vertex of the scalp and then expanding in a centrifugal and symmetrical pattern on the crown. Triggering or aggravation of the disease may occur following traumatic hair care practices, such as cornrows and braiding, extensions, weaves with sewn-in or glued-on hair, use of hot combs, and frequent use of hair relaxers. These practices should be discontinued to slow progression of disease. Therapies may stop or slow  progression but generally do not lead to hair regrowth in scarred areas.  Central centrifugal scarring alopecia  Related Medications Safety Seal Miscellaneous MISC Apply 1 Application topically every other day. Medication name: AA gel     SEBORRHEIC DERMATITIS Exam: Pink patches with greasy scale at the scalp   Stable but not at goal   Seborrheic Dermatitis is a chronic persistent rash characterized by pinkness and scaling most commonly of the mid face but also can occur on the scalp (dandruff), ears; mid chest, mid back and groin.  It tends to be exacerbated by stress and cooler weather.  People who have neurologic disease may experience new onset or exacerbation of existing seborrheic dermatitis.  The condition is not curable but treatable and can be controlled.   Treatment Plan: - Continue using DHS Zinc Shampoo. Allow it to remain on scalp for 2-3 minutes then rinse. Regular shampoo can be used with it.   Return for CCCA & Seb Derm.    Documentation: I have reviewed the above documentation for accuracy and completeness, and I agree with the above.   I, Shirron Marcha Solders, CMA, am acting as scribe for [employer], DO.   Langston Reusing, DO

## 2023-05-27 ENCOUNTER — Ambulatory Visit: Payer: Medicare PPO

## 2023-06-03 ENCOUNTER — Ambulatory Visit
Admission: RE | Admit: 2023-06-03 | Discharge: 2023-06-03 | Disposition: A | Discharge status: Home / Self Care | Payer: Medicare PPO | Source: Ambulatory Visit | Attending: Family Medicine | Admitting: Family Medicine

## 2023-06-03 DIAGNOSIS — Z1231 Encounter for screening mammogram for malignant neoplasm of breast: Secondary | ICD-10-CM

## 2023-07-04 ENCOUNTER — Encounter: Payer: Self-pay | Admitting: Dermatology

## 2023-07-04 ENCOUNTER — Ambulatory Visit (INDEPENDENT_AMBULATORY_CARE_PROVIDER_SITE_OTHER): Payer: Medicare PPO | Admitting: Dermatology

## 2023-07-04 VITALS — BP 117/75 | HR 102

## 2023-07-04 DIAGNOSIS — L6681 Central centrifugal cicatricial alopecia: Secondary | ICD-10-CM | POA: Diagnosis not present

## 2023-07-04 DIAGNOSIS — L649 Androgenic alopecia, unspecified: Secondary | ICD-10-CM | POA: Diagnosis not present

## 2023-07-04 DIAGNOSIS — L219 Seborrheic dermatitis, unspecified: Secondary | ICD-10-CM | POA: Diagnosis not present

## 2023-07-04 MED ORDER — SAFETY SEAL MISCELLANEOUS MISC: 1.0000 | 5 refills | Status: AC

## 2023-07-04 NOTE — Progress Notes (Signed)
   Follow-Up Visit   Subjective  Bailey Hines is a 78 y.o. female who presents for the following: CCCA, AA and Seb Derm  Patient present today for follow up visit for CCCA Seb Derm. Patient was last evaluated on 03/07/23. At this visit patient was prescribed AA gel and recommended washing with DHS Zinc Shampoo. She states she is also taking Vivascal and Vital Protein supplement. Patient reports sxs are unchanged. Patient denies medication changes.  The following portions of the chart were reviewed this encounter and updated as appropriate: medications, allergies, medical history  Review of Systems:  No other skin or systemic complaints except as noted in HPI or Assessment and Plan.  Objective  Well appearing patient in no apparent distress; mood and affect are within normal limits.  A focused examination was performed of the following areas: Scalp  Relevant exam findings are noted in the Assessment and Plan.              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 Full results not yet achieved. Areas around the ears where glasses rub are particularly challenging to treat. Current treatment regimen has shown progress, but further intervention may be necessary.  - Plan:    Consider Kenalog injections into hair follicles for deeper access and potentially better results    Option to start doxycycline 50 mg to reduce inflammation systemically    Alternative consideration of Plaquenil if doxycycline is not tolerated    Continue current minoxidil application, but restrict to morning use only to avoid facial hair growth    Discuss potential use of oral minoxidil, noting possible side effects of  lowered blood pressure and unwanted hair growth    Consider Platelet-Rich Plasma (PRP) therapy for stimulating hair growth    Suggest derma rolling twice weekly with frequent replacement of the device    Recommend red light therapy cap for daily use    Continue protein collagen and Viviscal supplements    Follow-up in 6 months, with option to schedule earlier for injections if desired   SEBORRHEIC DERMATITIS Exam: Pink patches with greasy scale at scalp  Well controlled  Seborrheic Dermatitis is a chronic persistent rash characterized by pinkness and scaling most commonly of the mid face but also can occur on the scalp (dandruff), ears; mid chest, mid back and groin.  It tends to be exacerbated by stress and cooler weather.  People who have neurologic disease may experience new onset or exacerbation of existing seborrheic dermatitis.  The condition is not curable but treatable and can be controlled.  Treatment Plan: - Recommended continuing washing DHS Zinc    Return in about 6 months (around 01/04/2024) for CCCA, AA and Seb Derm F/U.  Documentation: I have reviewed the above documentation for accuracy and completeness, and I agree with the above.  Stasia Cavalier, am acting as scribe for Langston Reusing, DO.  Langston Reusing, DO

## 2023-07-04 NOTE — Patient Instructions (Addendum)
 Dear Ms. Fredric Mare,  Thank you for visiting our clinic today. Your dedication to improving your health is commendable, and we are encouraged by the progress in your treatment. Below are the key instructions and options we discussed during your consultation:  Treatment Considerations: -Oral Doxycycline -Kenalog steroid Injections -Oral Plaquenil  Topical Application: Apply topical Minoxidil/Clobetasol treatment only in the morning to prevent medication transfer to your face via your pillow.  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 Be aware it may lower blood pressure and cause unwanted hair growth.  Hair Steaming: Exercise caution to avoid scalp burns. This method may be ineffective if follicles are miniaturizing or scarred.  PRP Therapy: Utilizes your own blood's growth factors, injected into areas needing stimulation.  Derma Rolling: An affordable option to stimulate blood flow through controlled micro-injuries.   Maintenance: Replace the roller frequently to prevent bacterial buildup.  Red Light Therapy: Consider using a red light cap to stimulate hair growth, which can be used during everyday activities like watching TV.  Please ensure to replace any derma rolling tools frequently, using them a maximum of twice before disposal to maintain hygiene.  We are looking forward to your follow-up in 6 months. Should you decide to proceed with any treatments before then, please do not hesitate to contact our office to schedule an appointment.  Thank you for your proactive approach to your health. We are here to support you and provide any further information or assistance you may need.  Warm regards,  Dr. [facility]    Important Information   Due to recent changes in healthcare laws, you may see results of your pathology and/or laboratory studies on MyChart before the doctors have had a chance to review them. We understand that in some cases there may be  results that are confusing or concerning to you. Please understand that not all results are received at the same time and often the doctors may need to interpret multiple results in order to provide you with the best plan of care or course of treatment. Therefore, we ask that you please give [place] 2 business days to thoroughly review all your results before contacting the office for clarification. Should we see a critical lab result, you will be contacted sooner.     If You Need Anything After Your Visit   If you have any questions or concerns for your doctor, please call our main line at [phone]. If no one answers, please leave a voicemail as directed and we will return your call as soon as possible. Messages left after 4 pm will be answered the following business day.    You may also send [place] a message via MyChart. We typically respond to MyChart messages within 1-2 business days.  For prescription refills, please ask your pharmacy to contact our office. Our fax number is [phone].  If you have an urgent issue when the clinic is closed that cannot wait until the next business day, you can page your doctor at the number below.     Please note that while we do our best to be available for urgent issues outside of office hours, we are not available 24/7.    If you have an urgent issue and are unable to reach [place], you may choose to seek medical care at your doctor's office, retail clinic, urgent care center, or emergency room.   If you have a medical emergency, please immediately call 911 or go to the emergency department. In the event of  inclement weather, please call our main line at [phone] for an update on the status of any delays or closures.  Dermatology Medication Tips: Please keep the boxes that topical medications come in in order to help keep track of the instructions about where and how to use these. Pharmacies typically print the medication instructions only on the boxes and  not directly on the medication tubes.   If your medication is too expensive, please contact our office at [phone] or send [place] a message through MyChart.    We are unable to tell what your co-pay for medications will be in advance as this is different depending on your insurance coverage. However, we may be able to find a substitute medication at lower cost or fill out paperwork to get insurance to cover a needed medication.    If a prior authorization is required to get your medication covered by your insurance company, please allow [place] 1-2 business days to complete this process.   Drug prices often vary depending on where the prescription is filled and some pharmacies may offer cheaper prices.   The website www.goodrx.com contains coupons for medications through different pharmacies. The prices here do not account for what the cost may be with help from insurance (it may be cheaper with your insurance), but the website can give you the price if you did not use any insurance.  - You can print the associated coupon and take it with your prescription to the pharmacy.  - You may also stop by our office during regular business hours and pick up a GoodRx coupon card.  - If you need your prescription sent electronically to a different pharmacy, notify our office through [facility] or by phone at [phone]

## 2023-11-05 DIAGNOSIS — I10 Essential (primary) hypertension: Secondary | ICD-10-CM | POA: Diagnosis not present

## 2023-11-05 DIAGNOSIS — E785 Hyperlipidemia, unspecified: Secondary | ICD-10-CM | POA: Diagnosis not present

## 2024-01-14 ENCOUNTER — Ambulatory Visit: Admitting: Dermatology

## 2024-03-16 ENCOUNTER — Other Ambulatory Visit: Payer: Self-pay | Admitting: Family Medicine

## 2024-03-16 ENCOUNTER — Other Ambulatory Visit (HOSPITAL_BASED_OUTPATIENT_CLINIC_OR_DEPARTMENT_OTHER): Payer: Self-pay | Admitting: Family Medicine

## 2024-03-16 DIAGNOSIS — A6000 Herpesviral infection of urogenital system, unspecified: Secondary | ICD-10-CM | POA: Diagnosis not present

## 2024-03-16 DIAGNOSIS — J301 Allergic rhinitis due to pollen: Secondary | ICD-10-CM | POA: Diagnosis not present

## 2024-03-16 DIAGNOSIS — E785 Hyperlipidemia, unspecified: Secondary | ICD-10-CM | POA: Diagnosis not present

## 2024-03-16 DIAGNOSIS — I10 Essential (primary) hypertension: Secondary | ICD-10-CM | POA: Diagnosis not present

## 2024-03-16 DIAGNOSIS — E2839 Other primary ovarian failure: Secondary | ICD-10-CM

## 2024-03-16 DIAGNOSIS — Z0000 Encounter for general adult medical examination without abnormal findings: Secondary | ICD-10-CM | POA: Diagnosis not present

## 2024-03-16 DIAGNOSIS — Z1331 Encounter for screening for depression: Secondary | ICD-10-CM | POA: Diagnosis not present

## 2024-03-16 DIAGNOSIS — Z1231 Encounter for screening mammogram for malignant neoplasm of breast: Secondary | ICD-10-CM

## 2024-06-04 ENCOUNTER — Ambulatory Visit

## 2024-06-19 ENCOUNTER — Ambulatory Visit
